# Patient Record
Sex: Male | Born: 1990 | Race: Black or African American | Hispanic: No | Marital: Single | Smoking: Former smoker
Health system: Southern US, Community
[De-identification: ages and names within clinical notes are randomized; demographics above are authoritative.]

## PROBLEM LIST (undated history)

## (undated) DIAGNOSIS — D509 Iron deficiency anemia, unspecified: Secondary | ICD-10-CM

## (undated) DIAGNOSIS — F419 Anxiety disorder, unspecified: Secondary | ICD-10-CM

## (undated) DIAGNOSIS — K649 Unspecified hemorrhoids: Secondary | ICD-10-CM

## (undated) HISTORY — DX: Iron deficiency anemia, unspecified: D50.9

## (undated) HISTORY — PX: COLONOSCOPY: SHX174

## (undated) HISTORY — PX: APPENDECTOMY: SHX54

## (undated) HISTORY — DX: Anxiety disorder, unspecified: F41.9

## (undated) HISTORY — DX: Unspecified hemorrhoids: K64.9

---
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
## 2009-01-15 ENCOUNTER — Ambulatory Visit: Payer: Self-pay | Admitting: Internal Medicine

## 2009-01-15 ENCOUNTER — Ambulatory Visit: Payer: Self-pay | Admitting: Family Medicine

## 2009-01-15 ENCOUNTER — Telehealth (INDEPENDENT_AMBULATORY_CARE_PROVIDER_SITE_OTHER): Payer: Self-pay | Admitting: *Deleted

## 2009-01-15 DIAGNOSIS — R519 Headache, unspecified: Secondary | ICD-10-CM | POA: Insufficient documentation

## 2009-01-15 DIAGNOSIS — R51 Headache: Secondary | ICD-10-CM | POA: Insufficient documentation

## 2009-01-15 LAB — CONVERTED CEMR LAB
ALT: 20 units/L (ref 0–53)
AST: 23 units/L (ref 0–37)
Albumin: 4.2 g/dL (ref 3.5–5.2)
Basophils Relative: 0.3 % (ref 0.0–3.0)
Bilirubin, Direct: 0.1 mg/dL (ref 0.0–0.3)
Calcium: 9.6 mg/dL (ref 8.4–10.5)
Chloride: 102 meq/L (ref 96–112)
Cholesterol: 133 mg/dL (ref 0–200)
Creatinine, Ser: 1.1 mg/dL (ref 0.4–1.5)
GFR calc non Af Amer: 110.65 mL/min (ref >60)
HDL: 37.9 mg/dL — ABNORMAL LOW (ref >39.00)
MCHC: 34.8 g/dL (ref 30.0–36.0)
MCV: 95.8 fL (ref 78.0–100.0)
Monocytes Absolute: 0.6 10*3/uL (ref 0.1–1.0)
Neutro Abs: 2.2 10*3/uL (ref 1.4–7.7)
Platelets: 148 10*3/uL — ABNORMAL LOW (ref 150.0–400.0)
RBC: 4.37 M/uL (ref 4.22–5.81)
RDW: 11.6 % (ref 11.5–14.6)
Sodium: 139 meq/L (ref 135–145)
TSH: 2.97 microintl units/mL (ref 0.35–5.50)
Triglycerides: 42 mg/dL (ref 0.0–149.0)
VLDL: 8.4 mg/dL (ref 0.0–40.0)
WBC: 4.7 10*3/uL (ref 4.5–10.5)

## 2009-02-04 ENCOUNTER — Ambulatory Visit: Payer: Self-pay | Admitting: Family Medicine

## 2009-02-04 DIAGNOSIS — J309 Allergic rhinitis, unspecified: Secondary | ICD-10-CM | POA: Insufficient documentation

## 2009-11-20 ENCOUNTER — Ambulatory Visit: Payer: Self-pay | Admitting: Family Medicine

## 2009-11-20 DIAGNOSIS — K644 Residual hemorrhoidal skin tags: Secondary | ICD-10-CM | POA: Insufficient documentation

## 2009-11-23 LAB — CONVERTED CEMR LAB
Basophils Absolute: 0 10*3/uL (ref 0.0–0.1)
Eosinophils Absolute: 0 10*3/uL (ref 0.0–0.7)
Eosinophils Relative: 0 % (ref 0–5)
Hemoglobin: 13.8 g/dL (ref 13.0–17.0)
Lymphocytes Relative: 57 % — ABNORMAL HIGH (ref 12–46)
Lymphs Abs: 2.7 10*3/uL (ref 0.7–4.0)
Monocytes Absolute: 0.4 10*3/uL (ref 0.1–1.0)
Neutro Abs: 1.6 10*3/uL — ABNORMAL LOW (ref 1.7–7.7)
Neutrophils Relative %: 34 % — ABNORMAL LOW (ref 43–77)
Platelets: 176 10*3/uL (ref 150–400)

## 2010-01-26 ENCOUNTER — Ambulatory Visit: Payer: Self-pay | Admitting: Family Medicine

## 2010-01-26 DIAGNOSIS — J029 Acute pharyngitis, unspecified: Secondary | ICD-10-CM | POA: Insufficient documentation

## 2010-01-27 ENCOUNTER — Encounter: Payer: Self-pay | Admitting: Family Medicine

## 2010-09-28 NOTE — Assessment & Plan Note (Signed)
Summary: bleeding hemorroids/kdc   Vital Signs:  Patient profile:   20 year old male Weight:      180 pounds BMI:     27.88 Pulse rate:   82 / minute BP sitting:   94 / 60  (left arm)  Vitals Entered By: Doristine Devoid (November 20, 2009 3:05 PM) CC: bleeding hemorrhoids noticed last week somewhat painful not external no problems w/ constipation   History of Present Illness: Pt here c/o ext hemrrhoids since last week.  No constipation, straining , no abd pain.     Current Medications (verified): 1)  Nasonex 50 Mcg/act Susp (Mometasone Furoate) .... 2 Sprays Each Nostril Once Daily 2)  Treximet 85-500 Mg Tabs (Sumatriptan-Naproxen Sodium) .Marland Kitchen.. 1 Tab As Needed For Severe Headache.  May Take 2nd Pill in 2 Hrs If Ha Not Improved.  Disp 1 Month 3)  Proctosol Hc 2.5 % Crea (Hydrocortisone) .... Apply Two Times A Day 4)  Anusol-Hc 25 Mg Supp (Hydrocortisone Acetate) .Marland Kitchen.. 1 Pr Two Times A Day  Allergies (verified): No Known Drug Allergies  Past History:  Past medical, surgical, family and social histories (including risk factors) reviewed for relevance to current acute and chronic problems.  Past Medical History: Reviewed history from 01/15/2009 and no changes required. Migraines  Past Surgical History: Reviewed history from 01/15/2009 and no changes required. Appendectomy  Family History: Reviewed history from 01/15/2009 and no changes required. CAD-no HTN-mother DM-no STROKE-no COLON CA-no PROSTATE CA-no  Social History: Reviewed history from 01/15/2009 and no changes required. 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 Rectal:  external hemorrhoid(s).  -- mult , large Psych:   Oriented X3 and normally interactive.     Impression & Recommendations:  Problem # 1:  EXTERNAL HEMORRHOIDS WITHOUT MENTION COMP (ICD-455.3) anusol and proctosol sitz baths Orders: Surgical Referral (Surgery) Venipuncture ([address]109)  Complete Medication List: 1)  Nasonex 50 Mcg/act Susp (Mometasone furoate) .... 2 sprays each nostril once daily 2)  Treximet 85-500 Mg Tabs (Sumatriptan-naproxen sodium) .Marland Kitchen.. 1 tab as needed for severe headache.  may take 2nd pill in 2 hrs if ha not improved.  disp 1 month 3)  Proctosol Hc 2.5 % Crea (Hydrocortisone) .... Apply two times a day 4)  Anusol-hc 25 Mg Supp (Hydrocortisone acetate) .Marland Kitchen.. 1 pr two times a day Prescriptions: ANUSOL-HC 25 MG SUPP (HYDROCORTISONE ACETATE) 1 pr two times a day  #2 weeks x 1   Entered and Authorized by:   Loreen Freud DO   Signed by:   Loreen Freud DO on 11/20/2009   Method used:   [facility] to        [facility]  [address] [phone]* (retail)       [address]       [place], [place]  [id number]       Ph: [phone] or [phone]       Fax: [phone]   RxID:   [id number] PROCTOSOL HC 2.5 % CREA (HYDROCORTISONE) apply two times a day  #2 weeks x 1   Entered and Authorized by:   Loreen Freud DO   Signed by:   Loreen Freud DO on 11/20/2009   Method used:   [facility]  [facility]  [address] [phone]* (retail)       [address]       [place], [place]  [id number]       Ph: [phone] or [phone]       Fax: [phone]   RxID:   [id number]

## 2010-09-28 NOTE — Assessment & Plan Note (Signed)
Summary: strep throat//lch   Vital Signs:  Patient profile:   20 year old male Weight:      173 pounds Temp:     98.6 degrees F oral BP sitting:   120 / 70  (left arm)  Vitals Entered By: Doristine Devoid (Jan 26, 2010 11:22 AM) CC: ST xsat.    History of Present Illness: 20 yo man here today for sore throat.  sxs started Saturday.  subjective fever.  no nausea, + anorexia.  no ear pain.  pain w/ turning neck.  + tender LAD.  no cough.  no known sick contacts  Allergies (verified): No Known Drug Allergies  Review of Systems      See HPI  Physical Exam  General:  Well-developed,well-nourished,in no acute distress; alert,appropriate and cooperative throughout examination Head:  Normocephalic and atraumatic without obvious abnormalities. No apparent alopecia or balding. Eyes:  no injxn or inflammation Ears:  External ear exam shows no significant lesions or deformities.  Otoscopic examination reveals clear canals, tympanic membranes are intact bilaterally without bulging, retraction, inflammation or discharge. Hearing is grossly normal bilaterally. Nose:  edematous turbinates bilaterally Mouth:  L tonsilar enlargement, + erythema and exudate. Neck:  + tender LAD on L Lungs:  Normal respiratory effort, chest expands symmetrically. Lungs are clear to auscultation, no crackles or wheezes. Heart:  Normal rate and regular rhythm. S1 and S2 normal without gallop, murmur, click, rub or other extra sounds.   Impression & Recommendations:  Problem # 1:  SORE THROAT (ICD-462) Assessment New despite (-) rapid strep will tx as strep and send for cx given appearance of L tonsil.  reviewed supportive care and red flags that should prompt return. Pt expresses understanding and is in agreement w/ this plan. His updated medication list for this problem includes:    Amoxicillin 500 Mg Caps (Amoxicillin) .Marland Kitchen... 1 tab by mouth two times a day x10 days  Orders: Specimen Handling (16109) T-Culture,  Throat ([address]454-09811) Rapid Strep (91478)  Complete Medication List: 1)  Nasonex 50 Mcg/act Susp (Mometasone furoate) .... 2 sprays each nostril once daily 2)  Treximet 85-500 Mg Tabs (Sumatriptan-naproxen sodium) .Marland Kitchen.. 1 tab as needed for severe headache.  may take 2nd pill in 2 hrs if ha not improved.  disp 1 month 3)  Amoxicillin 500 Mg Caps (Amoxicillin) .Marland Kitchen.. 1 tab by mouth two times a day x10 days  Patient Instructions: 1)  This looks like strep- we'll call you if we need to change the antibiotics 2)  Drink plenty of fluids 3)  Ibuprofen for pain and fever 4)  Call if you have increased pain, difficulty swallowing or breathing 5)  Hang in there!! Prescriptions: AMOXICILLIN 500 MG CAPS (AMOXICILLIN) 1 tab by mouth two times a day x10 days  #20 x 0   Entered and Authorized by:   Neena Rhymes MD   Signed by:   Neena Rhymes MD on 01/26/2010   Method used:   [facility]        [facility] [address] [phone]* (retail)       [address]       [place], [place]  [address]       Ph: [phone] or [phone]       Fax: [phone]   RxID:   [id number]   Laboratory Results    Other Tests  Rapid Strep: negative  Kit Test Internal QC: Positive   (Normal Range: Negative)

## 2010-10-30 IMAGING — CT CT HEAD W/O CM
1 series · 16 of 30 positions shown, 20 images · non-contrast
Comparison: 12/16/2005

CLINICAL DATA: Headaches

CT HEAD WITHOUT CONTRAST
TECHNIQUE: Contiguous axial images were obtained from the base of
the skull through the vertex without contrast.

[Series 2: head_seq 4.5 h37s st · axial · 0.43mm/px · z∈[-139,+5]mm · 16 of 36 slices shown, 20 images]
[im 2/36  brain]
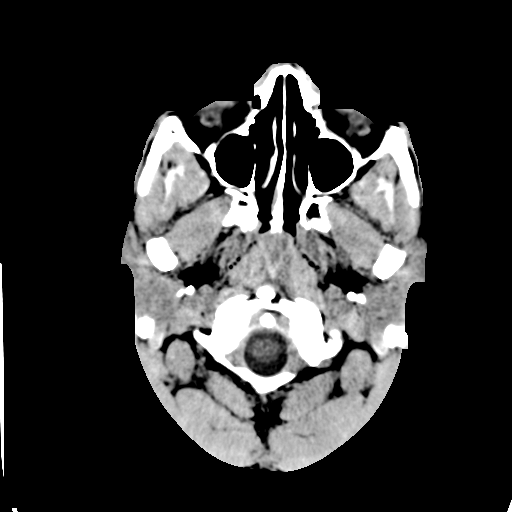
[im 2/36  bone]
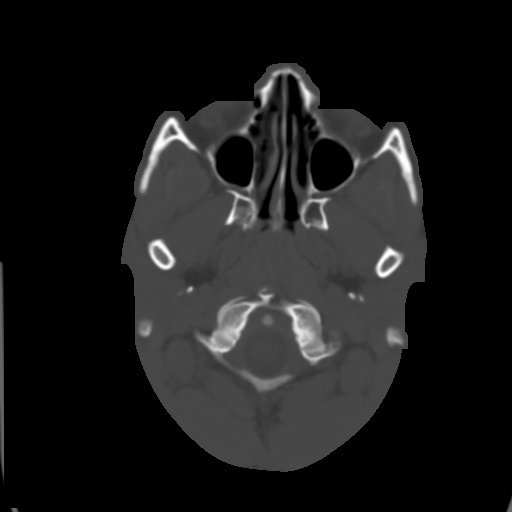
[im 4/36  brain]
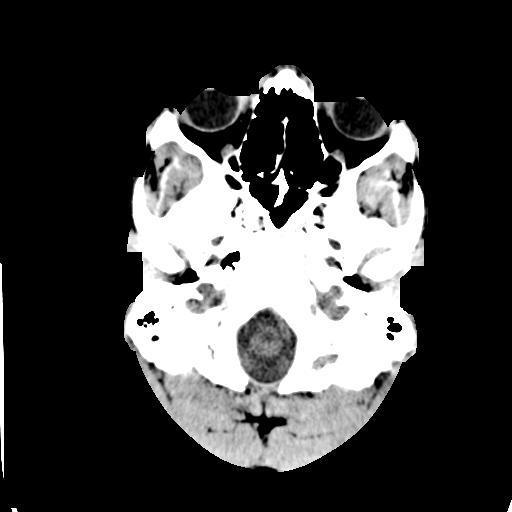
[im 7/36  brain]
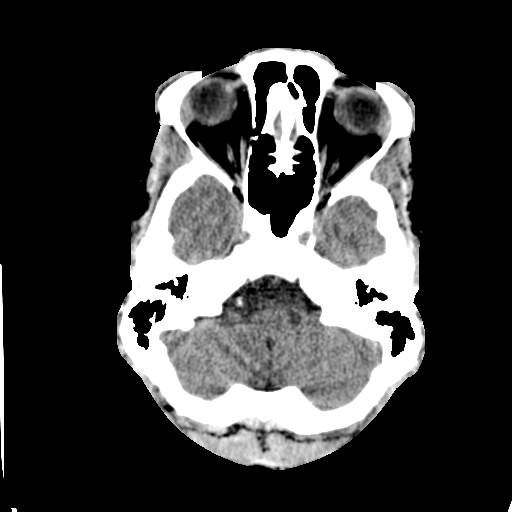
[im 9/36  brain]
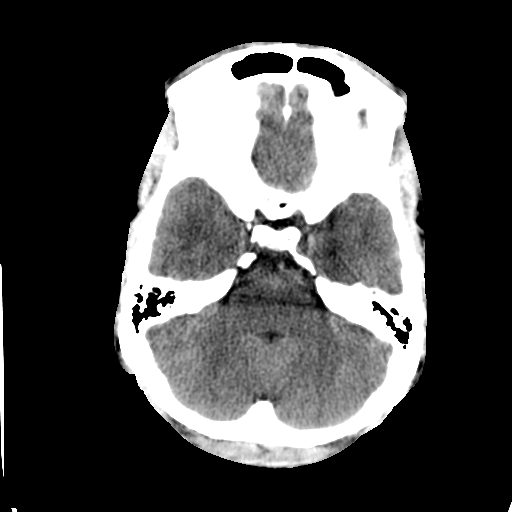
[im 10/36  brain]
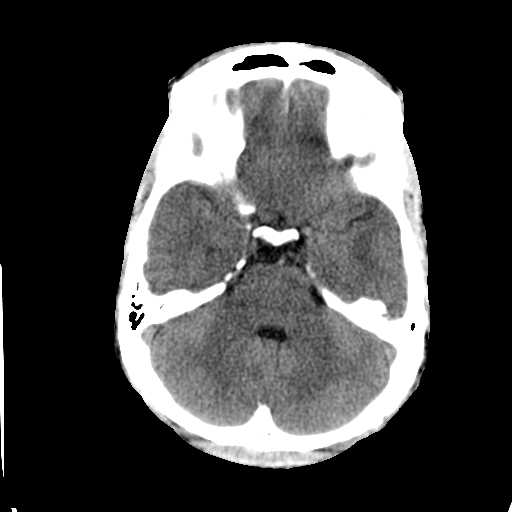
[im 10/36  bone]
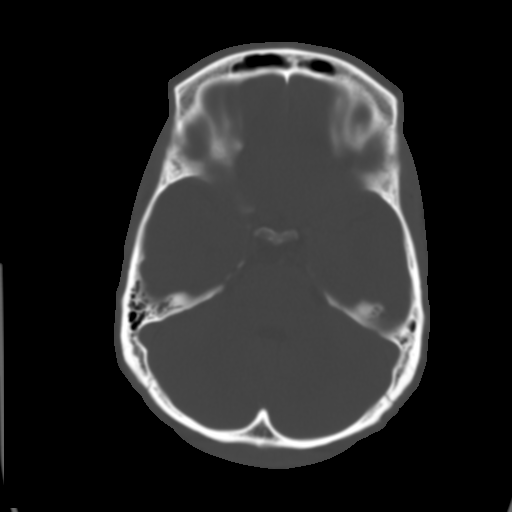
[im 13/36  brain]
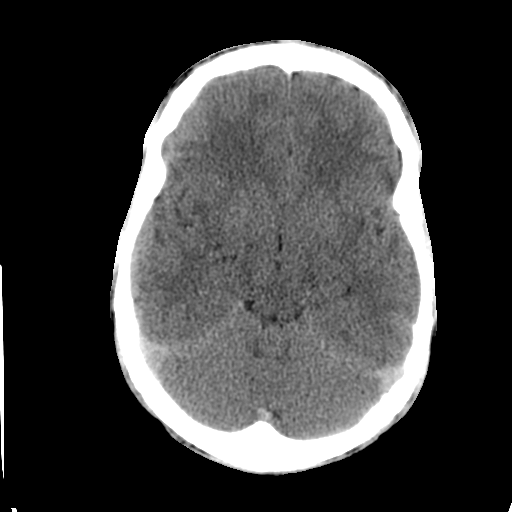
[im 15/36  brain]
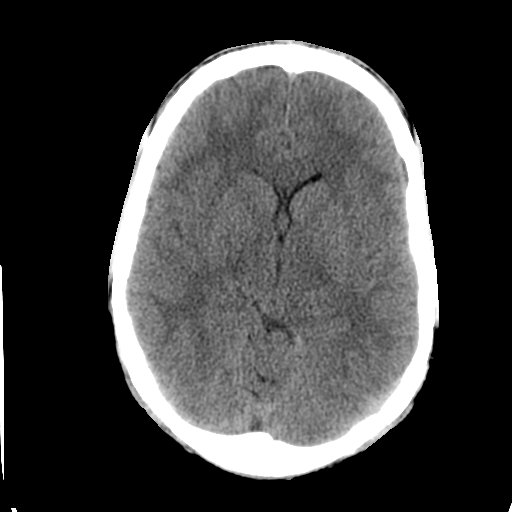
[im 17/36  brain]
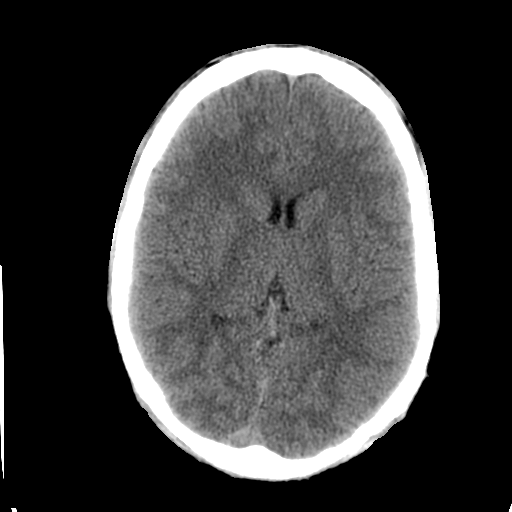
[im 19/36  brain]
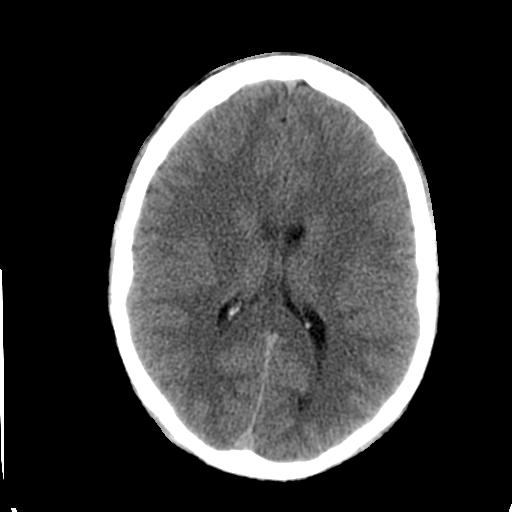
[im 19/36  bone]
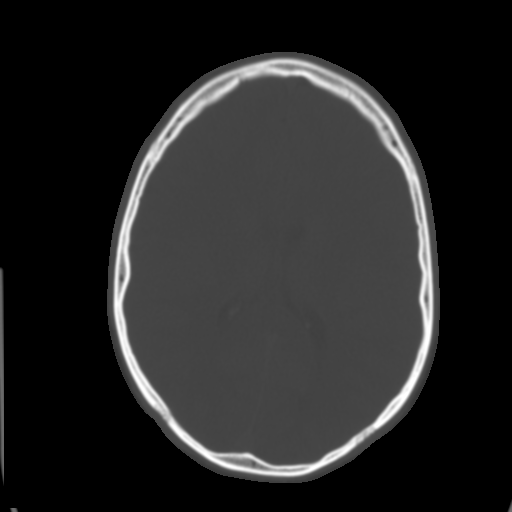
[im 21/36  brain]
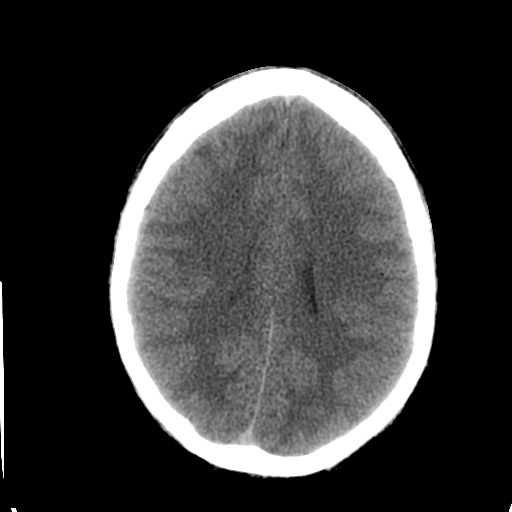
[im 23/36  brain]
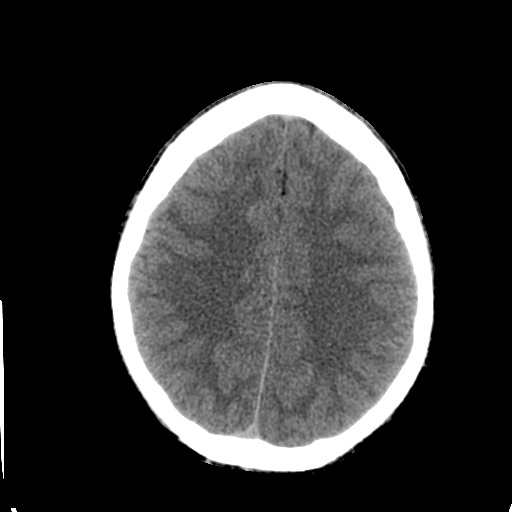
[im 26/36  brain]
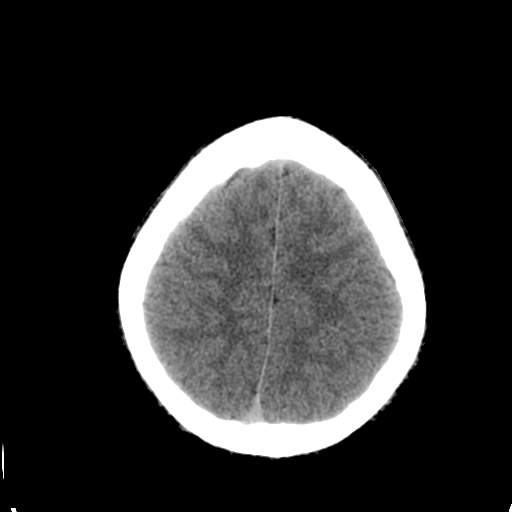
[im 27/36  brain]
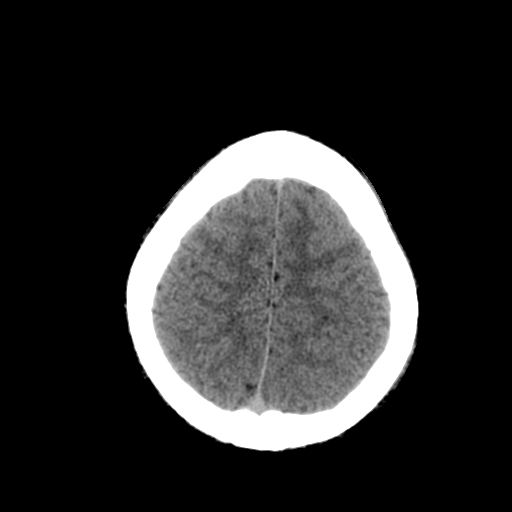
[im 27/36  bone]
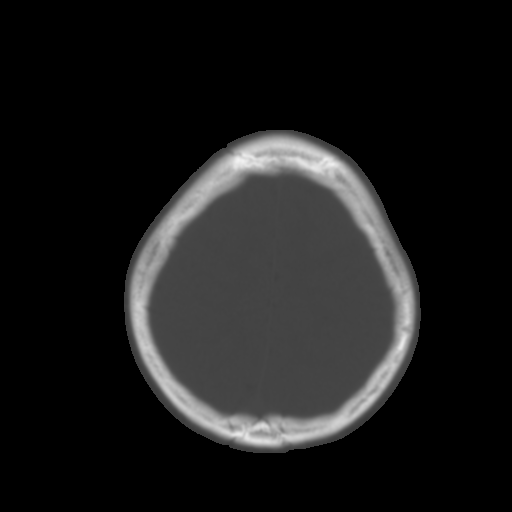
[im 29/36  brain]
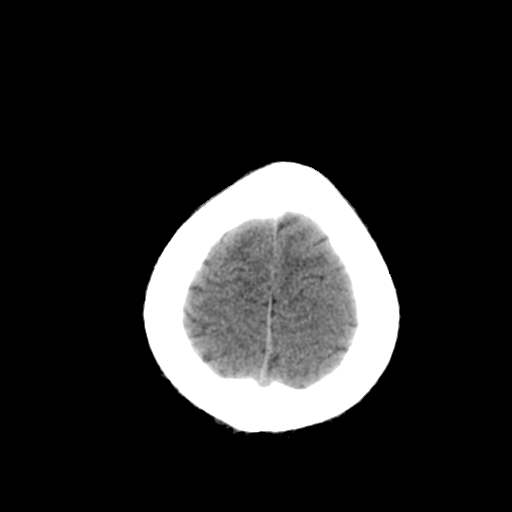
[im 32/36  brain]
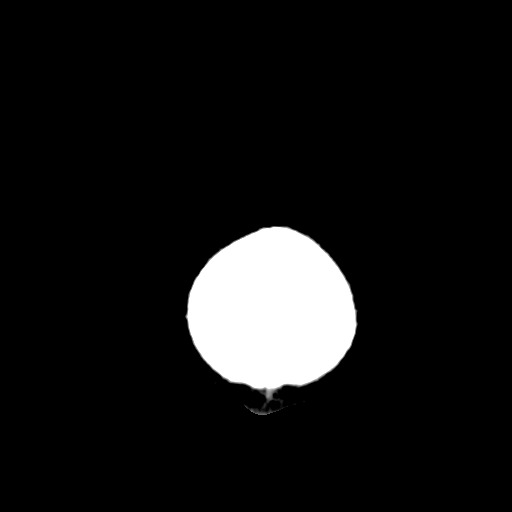
[im 34/36  brain]
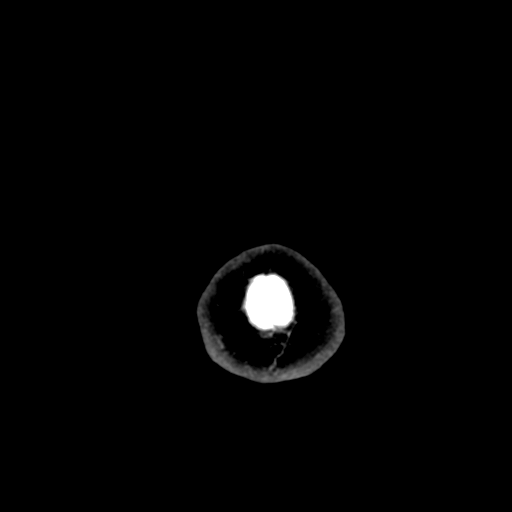

[16 of 30 positions shown; findings below may reference images not displayed]

FINDINGS: The brain has a normal appearance without evidence for
hemorrhage, infarction, hydrocephalus, or mass lesion.  There is no
extra axial fluid collection.  The skull and paranasal sinuses are
normal.
IMPRESSION: 1.  No acute intracranial abnormalities.

## 2011-01-11 NOTE — Discharge Summary (Signed)
NAME:  Dean Allen, Dean Allen              ACCOUNT NO.:  [id number]   MEDICAL RECORD NO.:  [id number]          PATIENT TYPE:  INP   LOCATION:  1522                         FACILITY:  [facility]   PHYSICIAN:  Lennie Muckle, MD      DATE OF BIRTH:  [birth year]   DATE OF ADMISSION:  12/30/2007  DATE OF DISCHARGE:  12/31/2007                               DISCHARGE SUMMARY   FINAL DIAGNOSIS:  Appendicitis.   HOSPITAL COURSE:  Mr. Lukes is an 20 year old male who was kept  postoperatively following laparoscopic appendectomy.  He had purulent  fluid and a positive appendicitis on laparoscopic exam.  He was kept on  IV Zosyn for 24 hours and is today tolerating a regular diet, having  pain well controlled, incisions sites are without evidence of infection,  and his abdominal exam reveals appropriate tenderness at the incision  site.   He will be discharged home on:  1. Augmentin for 5 days.  2. Percocet for pain.  3. Instructed to take over-the-counter stool softeners for      constipation.   ACTIVITY:  No heavy lifting greater than 20 pounds for 6 weeks.  He can  perform other activity as tolerated.   FOLLOWUP:  Lennie Muckle, M.D. in approximately 2-3 weeks.   Call if he develops any fevers or chills or increase in abdominal pain.      Lennie Muckle, MD  Electronically Signed     ALA/MEDQ  D:  12/31/2007  T:  12/31/2007  Job:  [id number]

## 2011-01-11 NOTE — Op Note (Signed)
NAME:  Dean Allen, Dean Allen              ACCOUNT NO.:  [id number]   MEDICAL RECORD NO.:  [id number]          PATIENT TYPE:  INP   LOCATION:  7564                         FACILITY:  [facility]   PHYSICIAN:  Lennie Muckle, MD      DATE OF BIRTH:  February 10, 1991   DATE OF PROCEDURE:  12/30/2007  DATE OF DISCHARGE:                               OPERATIVE REPORT   PREOPERATIVE DIAGNOSIS:  Appendicitis.   POSTOPERATIVE DIAGNOSIS:  Appendicitis.   PROCEDURE:  Laparoscopic appendectomy.   SURGEON:  Lennie Muckle, MD.   ASSISTANT:  None.   ANESTHESIA:  General endotracheal anesthesia.   SPECIMENS:  Appendix.   No immediate complications.  Normal amount of blood loss.  Appendix to  pathology for review.   INDICATIONS FOR PROCEDURE:  [name] is an 20 year old male who had had  abdominal pain for approximately 1 week which had worsened over the  course of 24 hours.  He had associated nausea and vomiting and anorexia.  CT showed inflamed appendix with some fluid in the pelvis.  Informed  consent was obtained with the patient and the mother for laparoscopic  appendectomy with possible open procedure with risks including but not  limited to bleeding, infection, post procedure abscess.   DESCRIPTION OF PROCEDURE:  [name] was identified in the preoperative  holding area, had received Zosyn 3.375 g in the emergency department.  He was then taken to the operating room and placed in a supine position.  After administration of general endotracheal anesthesia his abdomen was  prepped and draped in the usual sterile fashion.  A time-out procedure  indicating the patient and procedure were performed.  An incision was  placed at the umbilicus after anesthetizing the skin with 0.25%  Marcaine.  A Veress needle was placed in abdominal cavity to aid in  pneumoperitoneum.  The 5 mm trocar using the Optiview was then placed  through the umbilical incision.  There was no evidence of injury upon  placement of the  trocar or the Veress needle.  The abdomen was  inspected.  There was some murky fluid noted in abdominal cavity around  the liver and in the pelvis.  This was evacuated and suction irrigated.  A 5 mm port was placed in the right upper extremity after visualization  with the camera and a 12 mm trocar was placed in the left lower  quadrant.  The appendix was visualized coursing along the right pelvic  sidewall.  There were only a few adhesions that were able to be bluntly  dissected.  The lateral attachments were taken down with sharp  dissection as well as blunt dissection.  The appendix was retracted from  the base of the cecum.  I placed a, dissected a space between the  mesoappendix and the base of the appendix.  Using the laparoscopic  staple and #45 blue load I stapled at the base of appendix close to the  cecum.  The mesoappendix was also stapled.  The appendix was removed in  an EndoCatch bag.  The abdomen was irrigated with 2 liters of saline.  The  staple line was inspected and there was no evidence of bleeding and  the fluid was clear upon final exploration.  The 12 mm port site was  closed with zero Vicryl suture.  Upon final inspection of the abdominal  cavity there was no bleeding, no purulent fluid and no abdominal organ  injury.  The pneumoperitoneum was released.  The skin was closed with 4-  0 Monocryl  with Dermabond placed for final dressing.  The patient was extubated and  transferred to post anesthesia care unit in stable condition.  He will  be kept for 24 more hours of antibiotics and then likely be discharged  home tomorrow on Augmentin.      Lennie Muckle, MD  Electronically Signed     ALA/MEDQ  D:  12/30/2007  T:  12/30/2007  Job:  [id number]

## 2011-01-11 NOTE — H&P (Signed)
NAME:  Dean Allen, Dean Allen              ACCOUNT NO.:  [id number]   MEDICAL RECORD NO.:  [id number]          PATIENT TYPE:  EMS   LOCATION:  ED                           [facility]ACILITY:  [facility]   PHYSICIAN:  Lennie Muckle, MD      DATE OF BIRTH:  01/06/1991   DATE OF ADMISSION:  12/30/2007  DATE OF DISCHARGE:                              HISTORY & PHYSICAL   DIAGNOSIS:  Acute abdominal pain.   HISTORY OF PRESENT ILLNESS:  Dean Allen is an 20 year old male who had  onset of right lower quadrant pain approximately a week ago.  It  worsened in the past 24 hours with associated nausea and vomiting.  No  fevers or chills.  No diarrhea.  No difficulty with urination.  Has been  located in the right lower quadrant but has had some migration down to  the suprapubic area.  The pain is slightly worse with ambulation.  It is  persistent, has no alleviating factors.   PAST MEDICAL HISTORY:  Negative.   FAMILY HISTORY:  He is a Consulting civil engineer.   SOCIAL HISTORY:  No tobacco or alcohol use, occasional marijuana use.   MEDICATIONS:  None.   No known drug allergies.   REVIEW OF SYSTEMS:  Negative including cardiovascular, pulmonary,  gastrointestinal, neurologic, genitourinary and musculoskeletal.   PHYSICAL EXAMINATION:  Temperature is 98.4, pulse 74, blood pressure  130/55.  GENERAL:  He appears uncomfortable lying in bed, appears his stated age.  HEENT EXAM:  Extraocular muscles are intact.  No scleral icterus is  evident.  Pupils are equal and round.  CHEST:  Clear to auscultation bilaterally.  CARDIOVASCULAR:  Regular rate and rhythm.  ABDOMEN:  Tender in the right lower quadrant.  Positive Rovsing sign.  EXTREMITIES:  No deformity or edema are noted.  SKIN EXAM:  Grossly within normal limits.  NEUROLOGICAL EXAM:  Cranial nerves II-XII are grossly intact.  No focal  deficits.   LABORATORIES AND X-RAYS:  CT shows thickened appendix with small amount  of fluid in the pelvis.  White count is elevated at  14.3, hemoglobin and  hematocrit 14 and 40, platelets are 181.  Serum chemistries:  Mild  elevation of glucose to 125, otherwise within normal limits.   ASSESSMENT AND PLAN:  Appendicitis.  Laparoscopic appendectomy, possible  open procedure were explained to the patient and his mother who is  present in the room.  He will receive IV Zosyn now, and then depending  on the  findings at the time of the OR will either be able to discharge him home  later or will keep him for antibiotics.  I explained to the patient and  his mother the possibilities of a postoperative abscess and the risk of  minimal amount of bleeding during the procedure.  All questions were  answered.      Lennie Muckle, MD  Electronically Signed     ALA/MEDQ  D:  12/30/2007  T:  12/30/2007  Job:  [id number]

## 2012-10-02 ENCOUNTER — Emergency Department (HOSPITAL_COMMUNITY)
Admission: EM | Admit: 2012-10-02 | Discharge: 2012-10-02 | Disposition: A | Discharge status: Home / Self Care | Payer: 59 | Attending: Emergency Medicine | Admitting: Emergency Medicine

## 2012-10-02 ENCOUNTER — Encounter (HOSPITAL_COMMUNITY): Payer: Self-pay | Admitting: Emergency Medicine

## 2012-10-02 DIAGNOSIS — Z202 Contact with and (suspected) exposure to infections with a predominantly sexual mode of transmission: Secondary | ICD-10-CM | POA: Insufficient documentation

## 2012-10-02 MED ORDER — METRONIDAZOLE 500 MG PO TABS
2000.0000 mg | ORAL_TABLET | Freq: Once | ORAL | Status: AC
  Administered 2012-10-02: 2000 mg via ORAL
  Filled 2012-10-02: qty 4

## 2012-10-02 MED ORDER — AZITHROMYCIN 250 MG PO TABS
1000.0000 mg | ORAL_TABLET | Freq: Once | ORAL | Status: AC
  Administered 2012-10-02: 1000 mg via ORAL
  Filled 2012-10-02: qty 4

## 2012-10-02 MED ORDER — LIDOCAINE HCL (PF) 1 % IJ SOLN
INTRAMUSCULAR | Status: AC
  Administered 2012-10-02: 0.9 mL
  Filled 2012-10-02: qty 5

## 2012-10-02 MED ORDER — CEFTRIAXONE SODIUM 250 MG IJ SOLR
250.0000 mg | Freq: Once | INTRAMUSCULAR | Status: AC
  Administered 2012-10-02: 250 mg via INTRAMUSCULAR
  Filled 2012-10-02: qty 250

## 2012-10-02 NOTE — ED Notes (Signed)
Pt reports girlfriend informed him today that she is positive for trichomonas and that he needed to get treated. Pt states that last sexual activity with girlfriend was this past Saturday. Denies penile discharge, odor, pain.

## 2012-10-02 NOTE — ED Notes (Signed)
Pt sts exposed to trichomonas; pt denies complaint and sts here for treatment

## 2012-10-02 NOTE — ED Provider Notes (Signed)
History     CSN: [id number]  Arrival date & time 10/02/12  1826   First MD Initiated Contact with Patient 10/02/12 1840      Chief Complaint  Patient presents with  . Exposure to STD    (Consider location/radiation/quality/duration/timing/severity/associated sxs/prior treatment) HPI Dean Allen is a 22 y.o. male who presents with complaint of exposure to STDs. States a girl he dated just got diagnosed with trichomonas and possible gonorrhea, chlamydia. States wants to be treated. No symptoms. Last intercourse 4 days ago. No pain with urination. No penile discharge. No lesions.    History reviewed. No pertinent past medical history.  History reviewed. No pertinent past surgical history.  History reviewed. No pertinent family history.  History  Substance Use Topics  . Smoking status: Never Smoker   . Smokeless tobacco: Not on file  . Alcohol Use: Yes      Review of Systems  Constitutional: Negative for fever and chills.  Gastrointestinal: Negative for abdominal pain.  Genitourinary: Negative for discharge, penile swelling, scrotal swelling, genital sores, penile pain and testicular pain.    Allergies  Review of patient's allergies indicates no known allergies.  Home Medications  No current outpatient prescriptions on file.  BP 152/70  Pulse 86  Temp 98.3 F (36.8 C) (Oral)  Resp 18  SpO2 100%  Physical Exam  Nursing note and vitals reviewed. Constitutional: He appears well-developed and well-nourished. No distress.  Abdominal: There is no tenderness.  Genitourinary: Penis normal.    ED Course  Procedures (including critical care time)  Labs Reviewed - No data to display No results found.   1. Exposure to STD       MDM  PT with STD exposure, normal exam. No symptoms. Treated with rocephin 250IM, zithromax 1g, flagyl 2g. Follow up with health dept.         Lottie Mussel, PA 10/02/12 1939

## 2012-10-03 NOTE — ED Provider Notes (Signed)
Medical screening examination/treatment/procedure(s) were performed by non-physician practitioner and as supervising physician I was immediately available for consultation/collaboration.   Joya Gaskins, MD 10/03/12 Jorje Guild

## 2013-01-08 ENCOUNTER — Telehealth: Payer: Self-pay | Admitting: Internal Medicine

## 2013-01-08 ENCOUNTER — Telehealth: Payer: Self-pay | Admitting: Gastroenterology

## 2013-01-08 NOTE — Telephone Encounter (Signed)
S/W PT IN RE TO NP APPT 05/19 @ 3:30 W/DR/.MOHAMED. REFERRING DR. Lorin Picket [place] DX- MICROCYTIC ANEMIA WELCOME PACKET MAILED.

## 2013-01-08 NOTE — Telephone Encounter (Signed)
Pt will see [name], PA tomorrow at 09:15am; Malachi Bonds at [facility] is aware and will fax info and inform pt of appt.

## 2013-01-09 ENCOUNTER — Other Ambulatory Visit (INDEPENDENT_AMBULATORY_CARE_PROVIDER_SITE_OTHER): Payer: 59

## 2013-01-09 ENCOUNTER — Encounter: Payer: Self-pay | Admitting: Gastroenterology

## 2013-01-09 ENCOUNTER — Ambulatory Visit (INDEPENDENT_AMBULATORY_CARE_PROVIDER_SITE_OTHER): Payer: 59 | Admitting: Gastroenterology

## 2013-01-09 ENCOUNTER — Telehealth: Payer: Self-pay | Admitting: Internal Medicine

## 2013-01-09 ENCOUNTER — Encounter: Payer: Self-pay | Admitting: Internal Medicine

## 2013-01-09 VITALS — BP 118/72 | HR 123 | Ht 67.5 in | Wt 173.6 lb

## 2013-01-09 DIAGNOSIS — D509 Iron deficiency anemia, unspecified: Secondary | ICD-10-CM

## 2013-01-09 DIAGNOSIS — K922 Gastrointestinal hemorrhage, unspecified: Secondary | ICD-10-CM

## 2013-01-09 HISTORY — DX: Iron deficiency anemia, unspecified: D50.9

## 2013-01-09 LAB — IGA: IgA: 152 mg/dL (ref 68–378)

## 2013-01-09 MED ORDER — MOVIPREP 100 G PO SOLR: 1.0000 | Freq: Once | ORAL | Status: DC

## 2013-01-09 NOTE — Patient Instructions (Addendum)
You have been given a separate informational sheet regarding your tobacco use, the importance of quitting and local resources to help you quit. Your physician has requested that you go to the basement for the following lab work before leaving today:  Celiac Testing You have been scheduled for a colonoscopy with propofol. Please follow written instructions given to you at your visit today.  Please pick up your prep kit at the pharmacy within the next 1-3 days. If you use inhalers (even only as needed), please bring them with you on the day of your procedure. Your physician has requested that you go to www.startemmi.com and enter the access code given to you at your visit today. This web site gives a general overview about your procedure. However, you should still follow specific instructions given to you by our office regarding your preparation for the procedure. CC:  Alysia Penna MD

## 2013-01-09 NOTE — Progress Notes (Signed)
01/09/2013 Dean Allen [id number] Oct 04, 1990   HISTORY OF PRESENT ILLNESS:  Patient is a 22 year old male who presents to our office today at the request of his PCP.  He had labs performed, which showed a Hgb of 9.2 grams.  Iron studies were very low.  His PCP contacted him yesterday and asked him to begin taking iron pills 325 mg BID, which he started last night.  He was given 3 stool cards to check for occult blood and just performed one of those last night.  He says that his appetite is good and he eats a fairly healthy diet.  No weight loss, nausea, vomiting, constipation, diarrhea, abdominal pain, heartburn, reflux, or problems swallowing.  He admits that he sees blood in the toilet bowl a couple of times a month.  Feels like he has not energy.  No family history of colon cancer to his knowledge.  No dark black stools.  TSH, CMP, and remaining CBC are unremarkable.  Admits that he eats a lot of ice.   Past Medical History  Diagnosis Date  . Iron deficiency anemia   . Hemorrhoids    Past Surgical History  Procedure Laterality Date  . Appendectomy      reports that he has been smoking.  He has never used smokeless tobacco. He reports that  drinks alcohol. He reports that he does not use illicit drugs. family history includes Alcoholism in his father; Arthritis in his maternal grandfather; Breast cancer in his maternal grandmother; Glaucoma in his maternal grandfather; and Hypertension in his mother. No Known Allergies    Outpatient Encounter Prescriptions as of 01/09/2013  Medication Sig Dispense Refill  . ferrous sulfate 325 (65 FE) MG tablet Take 325 mg by mouth daily with breakfast.      . oxymetazoline (AFRIN) 0.05 % nasal spray Place 2 sprays into the nose 2 (two) times daily.       No facility-administered encounter medications on file as of 01/09/2013.     REVIEW OF SYSTEMS  : All other systems reviewed and negative except where noted in the History of Present  Illness.   PHYSICAL EXAM: BP 118/72  Pulse 123  Ht 5' 7.5" (1.715 m)  Wt 173 lb 9.6 oz (78.744 kg)  BMI 26.77 kg/m2  SpO2 98% General: Well developed black male in no acute distress Head: Normocephalic and atraumatic Eyes:  sclerae anicteric,conjunctive pink. Ears: Normal auditory acuity Lungs: Clear throughout to auscultation Heart: Regular rate and rhythm Abdomen: Soft, nontender, non-distended. No masses or hepatomegaly noted. Normal bowel sounds. Rectal: Deferred.  Will be performed at the time of colonoscopy. Musculoskeletal: Symmetrical with no gross deformities  Skin: No lesions on visible extremities Extremities: No edema  Neurological: Alert oriented x 4, grossly nonfocal Psychological:  Alert and cooperative. Normal mood and affect  ASSESSMENT AND PLAN: -IDA:  Hgb 9.2 grams and very low iron studies.  Will check labs for celiac.  Continue iron supplements as directed by his PCP (325 mg BID)/. -LGIB:  Blood in toilet bowl twice a month.  Will schedule colonoscopy.

## 2013-01-09 NOTE — Telephone Encounter (Signed)
C/D 01/09/13 for appt. 01/14/13

## 2013-01-10 NOTE — Progress Notes (Signed)
Agree with management.  Iva Boop, MD, Clementeen Graham

## 2013-01-14 ENCOUNTER — Telehealth: Payer: Self-pay | Admitting: Internal Medicine

## 2013-01-14 ENCOUNTER — Encounter: Payer: Self-pay | Admitting: Internal Medicine

## 2013-01-14 ENCOUNTER — Ambulatory Visit (HOSPITAL_BASED_OUTPATIENT_CLINIC_OR_DEPARTMENT_OTHER): Payer: 59 | Admitting: Internal Medicine

## 2013-01-14 ENCOUNTER — Other Ambulatory Visit (HOSPITAL_BASED_OUTPATIENT_CLINIC_OR_DEPARTMENT_OTHER): Payer: 59

## 2013-01-14 ENCOUNTER — Ambulatory Visit: Payer: 59

## 2013-01-14 VITALS — BP 129/81 | HR 77 | Temp 97.0°F | Resp 18 | Ht 68.0 in | Wt 175.6 lb

## 2013-01-14 DIAGNOSIS — D509 Iron deficiency anemia, unspecified: Secondary | ICD-10-CM

## 2013-01-14 LAB — COMPREHENSIVE METABOLIC PANEL (CC13)
ALT: 14 U/L (ref 0–55)
Albumin: 4 g/dL (ref 3.5–5.0)
CO2: 27 mEq/L (ref 22–29)
Chloride: 103 mEq/L (ref 98–107)
Creatinine: 1.2 mg/dL (ref 0.7–1.3)
Glucose: 103 mg/dl — ABNORMAL HIGH (ref 70–99)
Potassium: 4.1 mEq/L (ref 3.5–5.1)
Total Bilirubin: 0.6 mg/dL (ref 0.20–1.20)

## 2013-01-14 LAB — CBC & DIFF AND RETIC
BASO%: 1 % (ref 0.0–2.0)
Eosinophils Absolute: 0 10*3/uL (ref 0.0–0.5)
HCT: 31.7 % — ABNORMAL LOW (ref 38.4–49.9)
HGB: 9.2 g/dL — ABNORMAL LOW (ref 13.0–17.1)
MCH: 20.6 pg — ABNORMAL LOW (ref 27.2–33.4)
MONO#: 0.5 10*3/uL (ref 0.1–0.9)
MONO%: 11.5 % (ref 0.0–14.0)
NEUT%: 38.1 % — ABNORMAL LOW (ref 39.0–75.0)
RBC: 4.46 10*6/uL (ref 4.20–5.82)
RDW: 20.1 % — ABNORMAL HIGH (ref 11.0–14.6)
Retic Ct Abs: 107.04 10*3/uL — ABNORMAL HIGH (ref 34.80–93.90)

## 2013-01-14 LAB — LACTATE DEHYDROGENASE (CC13): LDH: 132 U/L (ref 125–245)

## 2013-01-14 MED ORDER — INTEGRA PLUS PO CAPS: 1.0000 | ORAL_CAPSULE | Freq: Every morning | ORAL | Status: DC

## 2013-01-14 NOTE — Progress Notes (Signed)
[facility] [phone]   Fax:([phone]  CONSULT NOTE  REFERRING PHYSICIAN: Dr. Alysia Penna  REASON FOR CONSULTATION:  22 years old African American male with Iron deficiency anemia  HPI Kyzen Horn is a 22 y.o. male was no significant past medical history he was seen his primary care physician Dr. Link Snuffer for routine physical examination and to establish care with him. The patient mentioned a history of hemorrhoids. And CBC performed on 01/04/2013 showed the hemoglobin of 9.1 hematocrit 29.5% with MCV of 67.1. Iron studies showed elevated total iron binding capacity 499 with iron saturation of 3% and serum iron of 17. Ferritin was 1.7.  The patient was referred to me today for evaluation and recommendation regarding his iron deficiency anemia. He was started on over the counter ferrous sulfate 1 tablet by mouth daily for one week. He was also referred to Dr. Elenore Paddy for consideration of colonoscopy because of his iron deficiency anemia. This is scheduled to be performed on 01/18/2013. He denied having any significant bleeding issues except for occasional hemorrhoidal bleed. He denied having any black tarry stool. He denied having any epistaxis, hematemesis, hemoptysis or hematuria. He has no bruises or ecchymosis. The patient denied having any significant weight loss or night sweats. He denied having any chest pain, shortness of breath, cough or hemoptysis. Family history significant for her maternal grandfather with colon cancer, mother has hypertension and dyslipidemia. Father with unknown medical history. The patient is single and has one child. He works in an [employer]. He smokes occasionally and I strongly advise him to quit smoking. He also drinks heavily on the weekends. I advised him to quit drinking. No history of drug abuse.  @SFHPI @  Past Medical History  Diagnosis Date  . Iron deficiency anemia   . Hemorrhoids     Past Surgical  History  Procedure Laterality Date  . Appendectomy      Family History  Problem Relation Age of Onset  . Glaucoma Maternal Grandfather   . Hypertension Mother   . Alcoholism Father   . Arthritis Maternal Grandfather   . Breast cancer Maternal Grandmother     Social History History  Substance Use Topics  . Smoking status: Current Some Day Smoker  . Smokeless tobacco: Never Used     Comment: started in 2010  . Alcohol Use: Yes     Comment: weekends will drink some liquor    No Known Allergies  Current Outpatient Prescriptions  Medication Sig Dispense Refill  . ferrous sulfate 325 (65 FE) MG tablet Take 325 mg by mouth daily with breakfast.      . FeFum-FePoly-FA-B Cmp-C-Biot (INTEGRA PLUS) CAPS Take 1 capsule by mouth every morning.  30 capsule  2  . oxymetazoline (AFRIN) 0.05 % nasal spray Place 2 sprays into the nose 2 (two) times daily.       No current facility-administered medications for this visit.    Review of Systems  A comprehensive review of systems was negative.  Physical Exam  AVW:UJWJX, healthy, no distress, well nourished and well developed SKIN: skin color, texture, turgor are normal HEAD: Normocephalic, No masses, lesions, tenderness or abnormalities EYES: normal, PERRLA EARS: External ears normal OROPHARYNX:no exudate and no erythema  NECK: supple, no adenopathy LYMPH:  no palpable lymphadenopathy, no hepatosplenomegaly LUNGS: clear to auscultation , and palpation HEART: regular rate & rhythm, no murmurs and no gallops ABDOMEN:abdomen soft, non-tender, normal bowel sounds and no masses or organomegaly BACK: Back symmetric, no  curvature. EXTREMITIES:no joint deformities, effusion, or inflammation, no edema, no skin discoloration  NEURO: alert & oriented x 3 with fluent speech, no focal motor/sensory deficits  PERFORMANCE STATUS: ECOG 1  LABORATORY DATA: Lab Results  Component Value Date   WBC 4.2 01/14/2013   HGB 9.2* 01/14/2013   HCT 31.7*  01/14/2013   MCV 71.1* 01/14/2013   PLT 233 01/14/2013      Chemistry      Component Value Date/Time   NA 139 01/14/2013 1546   NA 139 01/15/2009 0838   K 4.1 01/14/2013 1546   K 4.0 01/15/2009 0838   CL 103 01/14/2013 1546   CL 102 01/15/2009 0838   CO2 27 01/14/2013 1546   CO2 29 01/15/2009 0838   BUN 11.1 01/14/2013 1546   BUN 12 01/15/2009 0838   CREATININE 1.2 01/14/2013 1546   CREATININE 1.1 01/15/2009 0838      Component Value Date/Time   CALCIUM 9.3 01/14/2013 1546   CALCIUM 9.6 01/15/2009 0838   ALKPHOS 53 01/14/2013 1546   ALKPHOS 85 01/15/2009 0838   AST 19 01/14/2013 1546   AST 23 01/15/2009 0838   ALT 14 01/14/2013 1546   ALT 20 01/15/2009 0838   BILITOT 0.60 01/14/2013 1546   BILITOT 0.8 01/15/2009 0838       RADIOGRAPHIC STUDIES: No results found.  ASSESSMENT: This is a very pleasant 22 years old [place] American male with history of iron deficiency anemia most likely secondary to blood loss from GI source, questionable hemorrhoidal bleed versus other lower GI bleed.   PLAN: I had a lengthy discussion with the patient and his mother today about his condition. I ordered several studies to evaluate his anemia and to rule out any other etiology. I ordered repeat CBC, comprehensive metabolic panel, LDH, iron study, ferritin, hemoglobin electrophoresis. The patient is scheduled for colonoscopy on 01/18/2013.   I also recommended for the patient to start treatment with Integra plus 1 capsule by mouth daily for the next month. I will see him back for followup visit in one month with repeat CBC and iron study. I would consider seeing the patient earlier if there is any significant abnormality on his colonoscopy that require medical oncology attention. The patient and his mother agreed to the current plan. He was advised to call immediately if he has any concerning symptoms in the interval. All questions were answered. The patient knows to call the clinic with any problems, questions  or concerns. We can certainly see the patient much sooner if necessary.  Thank you so much for allowing me to participate in the care of [employer]. I will continue to follow up the patient with you and assist in his care.  I spent 30 minutes counseling the patient face to face. The total time spent in the appointment was 55 minutes.  Estiven Kohan K. 01/14/2013, 5:18 PM

## 2013-01-14 NOTE — Patient Instructions (Addendum)
You have iron deficiency anemia. I will start you on Integra plus 1 capsule by mouth daily. Colonoscopy as scheduled by Dr. Leone Payor

## 2013-01-14 NOTE — Progress Notes (Signed)
Checked in new patient. No financial issues. °

## 2013-01-14 NOTE — Telephone Encounter (Signed)
Gave pt appt for lab and md on June 2014

## 2013-01-16 ENCOUNTER — Telehealth: Payer: Self-pay | Admitting: Internal Medicine

## 2013-01-16 LAB — IRON AND TIBC
Iron: 133 ug/dL (ref 42–165)
TIBC: 478 ug/dL — ABNORMAL HIGH (ref 215–435)

## 2013-01-16 LAB — VITAMIN B12: Vitamin B-12: 366 pg/mL (ref 211–911)

## 2013-01-16 LAB — FOLATE: Folate: 9.1 ng/mL

## 2013-01-16 LAB — HEMOGLOBINOPATHY EVALUATION: Hemoglobin Other: 0 %

## 2013-01-16 NOTE — Telephone Encounter (Signed)
It is not a big deal - ok to hold now - can keep procedure as scheduled

## 2013-01-17 NOTE — Telephone Encounter (Signed)
Left a message for the pt on 01-16-13 informing him that per Dr. Leone Payor it is okay to proceed with his procedure, and to just hold on taking the iron pills.

## 2013-01-18 ENCOUNTER — Encounter: Payer: Self-pay | Admitting: Internal Medicine

## 2013-01-18 ENCOUNTER — Ambulatory Visit (AMBULATORY_SURGERY_CENTER): Payer: 59 | Admitting: Internal Medicine

## 2013-01-18 VITALS — BP 112/66 | HR 73 | Temp 98.4°F | Resp 12 | Ht 68.0 in | Wt 175.0 lb

## 2013-01-18 DIAGNOSIS — K648 Other hemorrhoids: Secondary | ICD-10-CM

## 2013-01-18 DIAGNOSIS — D509 Iron deficiency anemia, unspecified: Secondary | ICD-10-CM

## 2013-01-18 DIAGNOSIS — K922 Gastrointestinal hemorrhage, unspecified: Secondary | ICD-10-CM

## 2013-01-18 LAB — TISSUE TRANSGLUTAMINASE, IGA: Tissue Transglutaminase Ab, IgA: 1 U/mL (ref <4)

## 2013-01-18 MED ORDER — SODIUM CHLORIDE 0.9 % IV SOLN: 500.0000 mL | INTRAVENOUS | Status: DC

## 2013-01-18 NOTE — Progress Notes (Signed)
Patient did not experience any of the following events: a burn prior to discharge; a fall within the facility; wrong site/side/patient/procedure/implant event; or a hospital transfer or hospital admission upon discharge from the facility. (G8907) Patient did not have preoperative order for IV antibiotic SSI prophylaxis. (G8918)  

## 2013-01-18 NOTE — Progress Notes (Signed)
Report to pacu RN, vss, bbs=clear 

## 2013-01-18 NOTE — Patient Instructions (Addendum)
I found hemorrhoids which could be the cause of the anemia and low iron if they have been bleeding on a chronic basis. I can treat these and hopefully solve this problem by placing rubber bands on the hemorrhoids - office procedure. I have provided an information sheet about this and the website listed has videos you can watch. I am still waiting for the results of a blood test to check for celiac disease to come in - these have been delayed but they should process it.  I will contact you about that and we will also schedule a follow-up about the hemorrhoids.  There were no other problems seen at the colonoscopy - otherwise normal.  I appreciate the opportunity to care for you.  Iva Boop, MD, FACG  YOU HAD AN ENDOSCOPIC PROCEDURE TODAY AT THE [facility]: Refer to the procedure report that was given to you for any specific questions about what was found during the examination.  If the procedure report does not answer your questions, please call your gastroenterologist to clarify.  If you requested that your care partner not be given the details of your procedure findings, then the procedure report has been included in a sealed envelope for you to review at your convenience later.  YOU SHOULD EXPECT: Some feelings of bloating in the abdomen. Passage of more gas than usual.  Walking can help get rid of the air that was put into your GI tract during the procedure and reduce the bloating. If you had a lower endoscopy (such as a colonoscopy or flexible sigmoidoscopy) you may notice spotting of blood in your stool or on the toilet paper. If you underwent a bowel prep for your procedure, then you may not have a normal bowel movement for a few days.  DIET: Your first meal following the procedure should be a light meal and then it is ok to progress to your normal diet.  A half-sandwich or bowl of soup is an example of a good first meal.  Heavy or fried foods are harder to digest and may  make you feel nauseous or bloated.  Likewise meals heavy in dairy and vegetables can cause extra gas to form and this can also increase the bloating.  Drink plenty of fluids but you should avoid alcoholic beverages for 24 hours.  ACTIVITY: Your care partner should take you home directly after the procedure.  You should plan to take it easy, moving slowly for the rest of the day.  You can resume normal activity the day after the procedure however you should NOT DRIVE or use heavy machinery for 24 hours (because of the sedation medicines used during the test).    SYMPTOMS TO REPORT IMMEDIATELY: A gastroenterologist can be reached at any hour.  During normal business hours, 8:30 AM to 5:00 PM Monday through Friday, call [phone].  After hours and on weekends, please call the GI answering service at [phone] who will take a message and have the physician on call contact you.   Following lower endoscopy (colonoscopy or flexible sigmoidoscopy):  Excessive amounts of blood in the stool  Significant tenderness or worsening of abdominal pains  Swelling of the abdomen that is new, acute  Fever of 100F or higher  FOLLOW UP: If any biopsies were taken you will be contacted by phone or by letter within the next 1-3 weeks.  Call your gastroenterologist if you have not heard about the biopsies in 3 weeks.  Our staff will  call the home number listed on your records the next business day following your procedure to check on you and address any questions or concerns that you may have at that time regarding the information given to you following your procedure. This is a courtesy call and so if there is no answer at the home number and we have not heard from you through the emergency physician on call, we will assume that you have returned to your regular daily activities without incident.  SIGNATURES/CONFIDENTIALITY: You and/or your care partner have signed paperwork which will be entered into your  electronic medical record.  These signatures attest to the fact that that the information above on your After Visit Summary has been reviewed and is understood.  Full responsibility of the confidentiality of this discharge information lies with you and/or your care-partner.  Office will call to schedule appointment for follow-up to discuss hemorrhoid treatment  Hemorrhoid-handout given  You can take 1 table spoon of benefiber in morning to help with hemorrhoids

## 2013-01-18 NOTE — Op Note (Signed)
[facility] [address] [place], [address]   COLONOSCOPY PROCEDURE REPORT  PATIENT: Dean Allen, Dean Allen  MR#: [id number] BIRTHDATE: [birth year] , 22  yrs. old GENDER: Male ENDOSCOPIST: Iva Boop, MD, [facility] REFERRED BY:   Alysia Penna, MD PROCEDURE DATE:  01/18/2013 PROCEDURE:   Colonoscopy, diagnostic ASA CLASS:   Class II INDICATIONS:Rectal Bleeding and Iron Deficiency Anemia. MEDICATIONS: propofol (Diprivan) 300mg  IV, MAC sedation, administered by CRNA, and These medications were titrated to patient response per physician's verbal order  DESCRIPTION OF PROCEDURE:   After the risks benefits and alternatives of the procedure were thoroughly explained, informed consent was obtained.  A digital rectal exam revealed no rectal mass and A digital rectal exam revealed several skin tags.   The LB [id number]  endoscope was introduced through the anus and advanced to the terminal ileum which was intubated for a short distance. No adverse events experienced.   The quality of the prep was excellent, using MoviPrep  The instrument was then slowly withdrawn as the colon was fully examined.      COLON FINDINGS: Large internal hemorrhoids were found on insertion and withdrawal. appear to be RA, RP and LL.  A normal appearing cecum, ileocecal valve, and appendiceal orifice were identified. The ascending, hepatic flexure, transverse, splenic flexure, descending, sigmoid colon and proximal rectum appeared unremarkable.  No polyps or cancers were seen.  Retroflexed views revealed internal hemorrhoids. The time to cecum=2 minutes 14 seconds.  Withdrawal time=7 minutes 20 seconds.  The scope was withdrawn and the procedure completed. COMPLICATIONS: There were no complications.  ENDOSCOPIC IMPRESSION: 1.   Large internal hemorrhoids 2.   Normal colon - excellent prep  RECOMMENDATIONS: Hemorrhoids with chronic bleeding may be cause of iron-deficioency anemia. TTG  Ab to check for celiac is still pending (these have been delayed). We will call him to schedule an office follow-up to discuss hemorrhoid treatment.  handouts provided.   eSigned:  Iva Boop, MD, [facility] 01/18/2013 11:07 AM cc: The Patient, Alysia Penna, MD and Si Gaul, MD

## 2013-01-22 ENCOUNTER — Telehealth: Payer: Self-pay | Admitting: *Deleted

## 2013-01-22 ENCOUNTER — Telehealth: Payer: Self-pay

## 2013-01-22 NOTE — Telephone Encounter (Signed)
Left message for patient to call back  

## 2013-01-22 NOTE — Telephone Encounter (Signed)
Message copied by Annett Fabian on Tue Jan 22, 2013  3:19 PM ------      Message from: Iva Boop      Created: Fri Jan 18, 2013  2:06 PM      Regarding: appointment in next 1-2 weeks       Please add him on to the schedule sometime in next 1-2 weeks - I may get him started on hemorrhoid ligation             Want to see him in office and explain, etc ------

## 2013-01-22 NOTE — Telephone Encounter (Signed)
Patient is not able to come right now, requested an office visit in August.  He can't miss any more work at this time.  Office visit scheduled for 04/08/13 4:00

## 2013-01-22 NOTE — Telephone Encounter (Signed)
  Follow up Call-  Call back number 01/18/2013  Post procedure Call Back phone  # [phone]  Permission to leave phone message Yes     Patient questions:  Do you have a fever, pain , or abdominal swelling? no Pain Score  0 *  Have you tolerated food without any problems? yes  Have you been able to return to your normal activities? yes  Do you have any questions about your discharge instructions: Diet   no Medications  no Follow up visit  no  Do you have questions or concerns about your Care? yes  Actions: * If pain score is 4 or above: No action needed, pain <4.

## 2013-02-13 ENCOUNTER — Ambulatory Visit: Payer: 59 | Admitting: Internal Medicine

## 2013-02-19 ENCOUNTER — Other Ambulatory Visit (HOSPITAL_BASED_OUTPATIENT_CLINIC_OR_DEPARTMENT_OTHER): Payer: 59 | Admitting: Lab

## 2013-02-19 DIAGNOSIS — D509 Iron deficiency anemia, unspecified: Secondary | ICD-10-CM

## 2013-02-19 LAB — FERRITIN: Ferritin: 31 ng/mL (ref 22–322)

## 2013-02-19 LAB — CBC WITH DIFFERENTIAL/PLATELET
BASO%: 1 % (ref 0.0–2.0)
Basophils Absolute: 0 10e3/uL (ref 0.0–0.1)
EOS%: 0.5 % (ref 0.0–7.0)
Eosinophils Absolute: 0 10e3/uL (ref 0.0–0.5)
HCT: 37.7 % — ABNORMAL LOW (ref 38.4–49.9)
HGB: 11.9 g/dL — ABNORMAL LOW (ref 13.0–17.1)
LYMPH%: 49.9 % — ABNORMAL HIGH (ref 14.0–49.0)
MCH: 24 pg — ABNORMAL LOW (ref 27.2–33.4)
MCHC: 31.5 g/dL — ABNORMAL LOW (ref 32.0–36.0)
MCV: 76.2 fL — ABNORMAL LOW (ref 79.3–98.0)
MONO#: 0.6 10e3/uL (ref 0.1–0.9)
MONO%: 11.7 % (ref 0.0–14.0)
NEUT#: 1.8 10e3/uL (ref 1.5–6.5)
NEUT%: 36.9 % — ABNORMAL LOW (ref 39.0–75.0)
Platelets: 196 10e3/uL (ref 140–400)
RBC: 4.95 10e6/uL (ref 4.20–5.82)
RDW: 26.7 % — ABNORMAL HIGH (ref 11.0–14.6)
WBC: 5 10e3/uL (ref 4.0–10.3)
lymph#: 2.5 10e3/uL (ref 0.9–3.3)

## 2013-02-19 LAB — IRON AND TIBC
%SAT: 52 % (ref 20–55)
Iron: 233 ug/dL — ABNORMAL HIGH (ref 42–165)
TIBC: 448 ug/dL — ABNORMAL HIGH (ref 215–435)
UIBC: 215 ug/dL (ref 125–400)

## 2013-02-25 ENCOUNTER — Encounter: Payer: Self-pay | Admitting: Internal Medicine

## 2013-02-25 ENCOUNTER — Telehealth: Payer: Self-pay | Admitting: Internal Medicine

## 2013-02-25 ENCOUNTER — Ambulatory Visit (HOSPITAL_BASED_OUTPATIENT_CLINIC_OR_DEPARTMENT_OTHER): Payer: 59 | Admitting: Internal Medicine

## 2013-02-25 VITALS — BP 134/88 | HR 77 | Temp 96.8°F | Resp 18 | Ht 68.0 in | Wt 173.7 lb

## 2013-02-25 DIAGNOSIS — D509 Iron deficiency anemia, unspecified: Secondary | ICD-10-CM

## 2013-02-25 NOTE — Telephone Encounter (Signed)
gv and printed appt sched and avs for pt  °

## 2013-02-25 NOTE — Progress Notes (Signed)
[facility] Telephone:([phone]) [phone]   Fax:(336) [phone]  OFFICE PROGRESS NOTE  Alysia Penna, MD [address] [place] [address]  DIAGNOSIS: Iron deficiency anemia secondary to internal hemorrhoids.  PRIOR THERAPY: None  CURRENT THERAPY: Integra plus 1 capsule by mouth daily.  INTERVAL HISTORY: Dean Allen 22 y.o. male returns to the clinic today for followup visit accompanied by his mother. The patient is feeling much better today and has more energy and stamina since he was started on Integra plus. He denied having any significant fatigue or weakness. He denied having any bleeding issues. He had a colonoscopy under the care of Dr. Leone Payor last month and it showed internal hemorrhoids. The patient is tolerating his treatment with Integra plus fairly well. He had repeat CBC and iron study recently and he is here for evaluation and discussion of his lab results.  MEDICAL HISTORY: Past Medical History  Diagnosis Date  . Iron deficiency anemia   . Hemorrhoids     ALLERGIES:  has No Known Allergies.  MEDICATIONS:  Current Outpatient Prescriptions  Medication Sig Dispense Refill  . FeFum-FePoly-FA-B Cmp-C-Biot (INTEGRA PLUS) CAPS Take 1 capsule by mouth every morning.  30 capsule  2   No current facility-administered medications for this visit.    SURGICAL HISTORY:  Past Surgical History  Procedure Laterality Date  . Appendectomy      REVIEW OF SYSTEMS:  A comprehensive review of systems was negative.   PHYSICAL EXAMINATION: General appearance: alert, cooperative and no distress Head: Normocephalic, without obvious abnormality, atraumatic Neck: no adenopathy Lymph nodes: Cervical, supraclavicular, and axillary nodes normal. Resp: clear to auscultation bilaterally Cardio: regular rate and rhythm, S1, S2 normal, no murmur, click, rub or gallop GI: soft, non-tender; bowel sounds normal; no masses,  no organomegaly Extremities: extremities  normal, atraumatic, no cyanosis or edema  ECOG PERFORMANCE STATUS: 0 - Asymptomatic  Blood pressure 134/88, pulse 77, temperature 96.8 F (36 C), temperature source Oral, resp. rate 18, height 5\' 8"  (1.727 m), weight 173 lb 11.2 oz (78.79 kg).  LABORATORY DATA: Lab Results  Component Value Date   WBC 5.0 02/19/2013   HGB 11.9* 02/19/2013   HCT 37.7* 02/19/2013   MCV 76.2* 02/19/2013   PLT 196 02/19/2013      Chemistry      Component Value Date/Time   NA 139 01/14/2013 1546   NA 139 01/15/2009 0838   K 4.1 01/14/2013 1546   K 4.0 01/15/2009 0838   CL 103 01/14/2013 1546   CL 102 01/15/2009 0838   CO2 27 01/14/2013 1546   CO2 29 01/15/2009 0838   BUN 11.1 01/14/2013 1546   BUN 12 01/15/2009 0838   CREATININE 1.2 01/14/2013 1546   CREATININE 1.1 01/15/2009 0838      Component Value Date/Time   CALCIUM 9.3 01/14/2013 1546   CALCIUM 9.6 01/15/2009 0838   ALKPHOS 53 01/14/2013 1546   ALKPHOS 85 01/15/2009 0838   AST 19 01/14/2013 1546   AST 23 01/15/2009 0838   ALT 14 01/14/2013 1546   ALT 20 01/15/2009 0838   BILITOT 0.60 01/14/2013 1546   BILITOT 0.8 01/15/2009 0838       RADIOGRAPHIC STUDIES: No results found.  ASSESSMENT AND PLAN: This is a very pleasant 22 years old [place] American male with history of deficiency anemia secondary to internal hemorrhoids. The patient was started on treatment with Integra plus and he has significant improvement in his hemoglobin and hematocrit as well as  the iron study. I recommended for him to continue on Integra plus 1 capsule by mouth daily. I was him back for followup visit in 3 months with repeat CBC and iron study. He was advised to call immediately if he has any concerning symptoms in the interval.  All questions were answered. The patient knows to call the clinic with any problems, questions or concerns. We can certainly see the patient much sooner if necessary.

## 2013-02-25 NOTE — Patient Instructions (Signed)
Continue on Integra plus. Followup visit in 3 months with repeat CBC and iron study

## 2013-03-20 ENCOUNTER — Encounter: Payer: Self-pay | Admitting: *Deleted

## 2013-04-08 ENCOUNTER — Ambulatory Visit: Payer: 59 | Admitting: Internal Medicine

## 2013-05-16 ENCOUNTER — Telehealth: Payer: Self-pay | Admitting: Medical Oncology

## 2013-05-16 NOTE — Telephone Encounter (Signed)
Had labs done at work and he wil bring them by for next appt. I cancelled lab on 9/23.

## 2013-05-21 ENCOUNTER — Other Ambulatory Visit: Payer: 59 | Admitting: Lab

## 2013-05-21 ENCOUNTER — Telehealth: Payer: Self-pay | Admitting: *Deleted

## 2013-05-21 NOTE — Telephone Encounter (Signed)
Lab work from [employer] [facility] dated 04/25/13 given to Dr Donnald Garre to review.  SLJ

## 2013-05-28 ENCOUNTER — Ambulatory Visit: Payer: 59 | Admitting: Internal Medicine

## 2013-05-28 ENCOUNTER — Telehealth: Payer: Self-pay | Admitting: Internal Medicine

## 2013-05-28 NOTE — Telephone Encounter (Signed)
pt wanted to cx and call back to r/s

## 2014-01-15 ENCOUNTER — Other Ambulatory Visit: Payer: Self-pay | Admitting: Internal Medicine

## 2014-03-22 ENCOUNTER — Encounter (HOSPITAL_COMMUNITY): Payer: Self-pay | Admitting: Emergency Medicine

## 2014-03-22 ENCOUNTER — Emergency Department (INDEPENDENT_AMBULATORY_CARE_PROVIDER_SITE_OTHER)
Admission: EM | Admit: 2014-03-22 | Discharge: 2014-03-22 | Disposition: A | Discharge status: Home / Self Care | Payer: 59 | Source: Home / Self Care

## 2014-03-22 DIAGNOSIS — K644 Residual hemorrhoidal skin tags: Secondary | ICD-10-CM

## 2014-03-22 DIAGNOSIS — K645 Perianal venous thrombosis: Secondary | ICD-10-CM

## 2014-03-22 MED ORDER — HYDROCODONE-ACETAMINOPHEN 5-325 MG PO TABS: 1.0000 | ORAL_TABLET | ORAL | Status: DC | PRN

## 2014-03-22 MED ORDER — LIDOCAINE-HYDROCORTISONE ACE 3-0.5 % RE CREA: TOPICAL_CREAM | RECTAL | Status: DC

## 2014-03-22 NOTE — ED Provider Notes (Signed)
CSN: [id number]     Arrival date & time 03/22/14  1133 History   First MD Initiated Contact with Patient 03/22/14 1218     Chief Complaint  Patient presents with  . Hemorrhoids   (Consider location/radiation/quality/duration/timing/severity/associated sxs/prior Treatment) HPI Comments: Have had hemorrhoids all  My life. Now with moderate to severe pain and bleeding.    Past Medical History  Diagnosis Date  . Iron deficiency anemia   . Hemorrhoids    Past Surgical History  Procedure Laterality Date  . Appendectomy    . Colonoscopy      01/18/13   Family History  Problem Relation Age of Onset  . Glaucoma Maternal Grandfather   . Arthritis Maternal Grandfather   . Hypertension Mother   . Alcoholism Father   . Breast cancer Maternal Grandmother   . Colon cancer Neg Hx   . Esophageal cancer Neg Hx   . Rectal cancer Neg Hx   . Stomach cancer Neg Hx    History  Substance Use Topics  . Smoking status: Former Games developermoker  . Smokeless tobacco: Never Used     Comment: started in 2010  . Alcohol Use: Yes     Comment: daily mixed drink    Review of Systems  Constitutional: Positive for activity change.  Gastrointestinal: Negative.   Skin: Negative for rash.    Allergies  Review of patient's allergies indicates no known allergies.  Home Medications   Prior to Admission medications   Medication Sig Start Date End Date Taking? Authorizing Provider  FeFum-FePoly-FA-B Cmp-C-Biot (INTEGRA PLUS) CAPS TAKE 1 CAPSULE BY MOUTH EVERY MORNING. 01/15/14  Yes Si GaulMohamed Mohamed, MD  HYDROcodone-acetaminophen (NORCO/VICODIN) 5-325 MG per tablet Take 1 tablet by mouth every 4 (four) hours as needed. 03/22/14   Hayden Rasmussenavid Jaevion Goto, NP  lidocaine-hydrocortisone [facility]NAMANTEL HC) 3-0.5 % CREA One application to rectum tid 03/22/14   Hayden Rasmussenavid Kindle Strohmeier, NP   BP 142/87  Pulse 80  Temp(Src) 97.5 F (36.4 C) (Oral)  SpO2 96% Physical Exam  Nursing note and vitals reviewed. Constitutional: He is oriented to person,  place, and time. He appears well-developed and well-nourished. No distress.  Neck: Normal range of motion. Neck supple.  Pulmonary/Chest: Effort normal.  Genitourinary:  Large thrombosed exterior hemorrhoid, Bleeding. Exquisite tenderness.   Neurological: He is alert and oriented to person, place, and time. He exhibits normal muscle tone.  Skin: Skin is warm and dry.  Psychiatric: He has a normal mood and affect.    ED Course  Procedures (including critical care time) Labs Review Labs Reviewed - No data to display  Imaging Review No results found.   MDM   1. External hemorrhoid, thrombosed   2. External hemorrhoid, bleeding     After explaining the procedure the pt declined due to the pain and severe tenderness. I offered to refer to surgeon and he accepted. Pt wants general anesthesia or concious sedation      Hayden Rasmussenavid Taniya Dasher, NP 03/22/14 1248

## 2014-03-22 NOTE — ED Provider Notes (Signed)
Medical screening examination/treatment/procedure(s) were performed by a resident physician or non-physician practitioner and as the supervising physician I was immediately available for consultation/collaboration.  Millan Legan, MD Family Medicine   Jamiel Goncalves J Mackay Hanauer, MD 03/22/14 1927 

## 2014-03-22 NOTE — ED Notes (Signed)
States he has had problems with external hemorrhoids "my whole life".  Usually uses Preparation H until flare-up resolved.  Has not had surgery for hemorrhoids.  Now having a flare-up with severe pain and "a lot" of bleeding - pt wearing an adult diaper due to bleeding.

## 2014-03-22 NOTE — Discharge Instructions (Signed)
Hemorrhoids °Hemorrhoids are swollen veins around the rectum or anus. There are two types of hemorrhoids:  °· Internal hemorrhoids. These occur in the veins just inside the rectum. They may poke through to the outside and become irritated and painful. °· External hemorrhoids. These occur in the veins outside the anus and can be felt as a painful swelling or hard lump near the anus. °CAUSES °· Pregnancy.   °· Obesity.   °· Constipation or diarrhea.   °· Straining to have a bowel movement.   °· Sitting for long periods on the toilet. °· Heavy lifting or other activity that caused you to strain. °· Anal intercourse. °SYMPTOMS  °· Pain.   °· Anal itching or irritation.   °· Rectal bleeding.   °· Fecal leakage.   °· Anal swelling.   °· One or more lumps around the anus.   °DIAGNOSIS  °Your caregiver may be able to diagnose hemorrhoids by visual examination. Other examinations or tests that may be performed include:  °· Examination of the rectal area with a gloved hand (digital rectal exam).   °· Examination of anal canal using a small tube (scope).   °· A blood test if you have lost a significant amount of blood. °· A test to look inside the colon (sigmoidoscopy or colonoscopy). °TREATMENT °Most hemorrhoids can be treated at home. However, if symptoms do not seem to be getting better or if you have a lot of rectal bleeding, your caregiver may perform a procedure to help make the hemorrhoids get smaller or remove them completely. Possible treatments include:  °· Placing a rubber band at the base of the hemorrhoid to cut off the circulation (rubber band ligation).   °· Injecting a chemical to shrink the hemorrhoid (sclerotherapy).   °· Using a tool to burn the hemorrhoid (infrared light therapy).   °· Surgically removing the hemorrhoid (hemorrhoidectomy).   °· Stapling the hemorrhoid to block blood flow to the tissue (hemorrhoid stapling).   °HOME CARE INSTRUCTIONS  °· Eat foods with fiber, such as whole grains, beans,  nuts, fruits, and vegetables. Ask your doctor about taking products with added fiber in them (fiber supplements). °· Increase fluid intake. Drink enough water and fluids to keep your urine clear or pale yellow.   °· Exercise regularly.   °· Go to the bathroom when you have the urge to have a bowel movement. Do not wait.   °· Avoid straining to have bowel movements.   °· Keep the anal area dry and clean. Use wet toilet paper or moist towelettes after a bowel movement.   °· Medicated creams and suppositories may be used or applied as directed.   °· Only take over-the-counter or prescription medicines as directed by your caregiver.   °· Take warm sitz baths for 15-20 minutes, 3-4 times a day to ease pain and discomfort.   °· Place ice packs on the hemorrhoids if they are tender and swollen. Using ice packs between sitz baths may be helpful.   °· Put ice in a plastic bag.   °· Place a towel between your skin and the bag.   °· Leave the ice on for 15-20 minutes, 3-4 times a day.   °· Do not use a donut-shaped pillow or sit on the toilet for long periods. This increases blood pooling and pain.   °SEEK MEDICAL CARE IF: °· You have increasing pain and swelling that is not controlled by treatment or medicine. °· You have uncontrolled bleeding. °· You have difficulty or you are unable to have a bowel movement. °· You have pain or inflammation outside the area of the hemorrhoids. °MAKE SURE YOU: °· Understand these instructions. °·   Will watch your condition.  Will get help right away if you are not doing well or get worse. Document Released: 08/12/2000 Document Revised: 08/01/2012 Document Reviewed: 06/19/2012 [facility] Patient Information 2015 [place], [place]. This information is not intended to replace advice given to you by your health care provider. Make sure you discuss any questions you have with your health care provider.   Disposable Sitz Bath A disposable sitz bath is a plastic basin that fits over the toilet.  A bag is hung above the toilet and is connected to a tube that opens into the disposable sitz bath. The bag is filled with warm water that can flow into the basin through the tube.  HOW TO USE A DISPOSABLE SITZ BATH 1. Close the clamp on the tubing before filling the bag with water. This is to prevent leakage. 2. Fill the sitz bath basin and the plastic bag with warm water. 3. Place the filled basin on the toilet with the seat raised. Make sure the overflow opening is facing toward the back of the toilet. 4. Hang the filled plastic bag overhead on a hook or towel rack close to the toilet. When the bag is unclamped, a steady stream of water will flow from the bag, through the tubing, and into the basin. 5. Attach the tubing to the opening on the basin. 6. Sit on the basin positioned on the toilet seat and release the clamp. This will allow warm water to flush the area around your genitals and anus (perineum). 7. Remain sitting on the basin for approximately 15 to 20 minutes. 8. Stand up and pat the perineum area dry. If needed, apply clean bandages (dressings) to the affected area. 9. Tip the basin into the toilet to remove any remaining water and flush the toilet. 10. Wash the basin with warm water and soap. Let it dry in the sink. 11. Store the basin and tubing in a clean, dry area. 12. Wash your hands with soap and water. SEEK MEDICAL CARE IF: You get worse instead of better. Stop the sitz baths if you get worse. MAKE SURE YOU:  Understand these instructions.  Will watch your condition.  Will get help right away if you are not doing well or get worse. Document Released: 02/14/2012 Document Revised: 05/09/2012 Document Reviewed: 02/14/2012 [facility] Patient Information 2015 [place], [place]. This information is not intended to replace advice given to you by your health care provider. Make sure you discuss any questions you have with your health care  provider.  Hemorrhoidectomy Hemorrhoidectomy is surgery to remove hemorrhoids. Hemorrhoids are veins that have become swollen in the rectum. The rectum is the area from the bottom end of the intestines to the opening where bowel movements leave the body. Hemorrhoids can be uncomfortable. They can cause itching, bleeding and pain if a blood clot forms in them (thrombose). If hemorrhoids are small, surgery may not be needed. But if they cover a larger area, surgery is usually suggested.  LET YOUR CAREGIVER KNOW ABOUT:   Any allergies.  All medications you are taking, including:  Herbs, eyedrops, over-the-counter medications and creams.  Blood thinners (anticoagulants), aspirin or other drugs that could affect blood clotting.  Use of steroids (by mouth or as creams).  Previous problems with anesthetics, including local anesthetics.  Possibility of pregnancy, if this applies.  Any history of blood clots.  Any history of bleeding or other blood problems.  Previous surgery.  Smoking history.  Other health problems. RISKS AND COMPLICATIONS All surgery  carries some risk. However, hemorrhoid surgery usually goes smoothly. Possible complications could include:  Urinary retention.  Bleeding.  Infection.  A painful incision.  A reaction to the anesthesia (this is not common). BEFORE THE PROCEDURE   Stop using aspirin and non-steroidal anti-inflammatory drugs (NSAIDs) for pain relief. This includes prescription drugs and over-the-counter drugs such as ibuprofen and naproxen. Also stop taking vitamin E. If possible, do this two weeks before your surgery.  If you take blood-thinners, ask your healthcare provider when you should stop taking them.  You will probably have blood and urine tests done several days before your surgery.  Do not eat or drink for about 8 hours before the surgery.  Arrive at least an hour before the surgery, or whenever your surgeon recommends. This will  give you time to check in and fill out any needed paperwork.  Hemorrhoidectomy is often an outpatient procedure. This means you will be able to go home the same day. Sometimes, though, people stay overnight in the hospital after the procedure. Ask your surgeon what to expect. Either way, make arrangements in advance for someone to drive you home. PROCEDURE   The preparation:  You will change into a hospital gown.  You will be given an IV. A needle will be inserted in your arm. Medication can flow directly into your body through this needle.  You might be given an enema to clear your rectum.  Once in the operating room, you will probably lie on your side or be repositioned later to lying on your stomach.  You will be given anesthesia (medication) so you will not feel anything during the surgery. The surgery often is done with local anesthesia (the area near the hemorrhoids will be numb and you will be drowsy but awake). Sometimes, general anesthesia is used (you will be asleep during the procedure).  The procedure:  There are a few different procedures for hemorrhoids. Be sure to ask you surgeon about the procedure, the risks and benefits.  Be sure to ask about what you need to do to take care of the wound, if there is one. AFTER THE PROCEDURE  You will stay in a recovery area until the anesthesia has worn off. Your blood pressure and pulse will be checked every so often.  You may feel a lot of pain in the area of the rectum.  Take all pain medication prescribed by your surgeon. Ask before taking any over-the-counter pain medicines.  Sometimes sitting in a warm bath can help relieve your pain.  To make sure you have bowel movements without straining:  You will probably need to take stool softeners (usually a pill) for a few days.  You should drink 8 to 10 glasses of water each day.  Your activity will be restricted for awhile. Ask your caregiver for a list of what you should and  should not do while you recover. Document Released: 06/12/2009 Document Revised: 11/07/2011 Document Reviewed: 06/12/2009 [facility] Patient Information 2015 [place], [place]. This information is not intended to replace advice given to you by your health care provider. Make sure you discuss any questions you have with your health care provider.

## 2014-03-25 ENCOUNTER — Encounter (INDEPENDENT_AMBULATORY_CARE_PROVIDER_SITE_OTHER): Payer: Self-pay | Admitting: Surgery

## 2014-03-25 ENCOUNTER — Ambulatory Visit (INDEPENDENT_AMBULATORY_CARE_PROVIDER_SITE_OTHER): Payer: 59 | Admitting: Surgery

## 2014-03-25 VITALS — BP 126/82 | HR 72 | Temp 97.5°F | Ht 67.0 in | Wt 173.0 lb

## 2014-03-25 DIAGNOSIS — K645 Perianal venous thrombosis: Secondary | ICD-10-CM

## 2014-03-25 NOTE — Progress Notes (Signed)
Subjective:     Patient ID: Dean Allen, male   DOB: 08/15/1991, 23 y.o.   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 He had a lot of bleeding and now after preparation H. And steroid cream he is feeling better. He reports that he has had problems with hemorrhoids most of his life. Of occasional bleeding. Over the weekend, he developed a painful area after being stressed. Again it is now improved.  Review of Systems     Objective:   Physical Exam On exam, he has external skin tag and consistent with hemorrhoids were once swollen. He could tell where he had had one thrombosed hemorrhoid which is now self ruptured. Because of his tenderness, I could not do a digital exam    Assessment:     Thrombosed external hemorrhoid     Plan:     I discussed the diagnosis with him in detail. I recommend he continue the MiraLAX, sitz bath, and steroid cream. If he improves, he can see us back as needed. If he fails to improve, he can come back and we can consider hemorrhoidectomy

## 2014-03-25 NOTE — Patient Instructions (Signed)

## 2014-05-24 ENCOUNTER — Emergency Department (INDEPENDENT_AMBULATORY_CARE_PROVIDER_SITE_OTHER)
Admission: EM | Admit: 2014-05-24 | Discharge: 2014-05-24 | Disposition: A | Discharge status: Home / Self Care | Payer: 59 | Source: Home / Self Care | Attending: Family Medicine | Admitting: Family Medicine

## 2014-05-24 ENCOUNTER — Encounter (HOSPITAL_COMMUNITY): Payer: Self-pay | Admitting: Emergency Medicine

## 2014-05-24 DIAGNOSIS — S61409A Unspecified open wound of unspecified hand, initial encounter: Secondary | ICD-10-CM

## 2014-05-24 DIAGNOSIS — S61411A Laceration without foreign body of right hand, initial encounter: Secondary | ICD-10-CM

## 2014-05-24 DIAGNOSIS — Z23 Encounter for immunization: Secondary | ICD-10-CM

## 2014-05-24 MED ORDER — LIDOCAINE HCL (PF) 2 % IJ SOLN
INTRAMUSCULAR | Status: AC
  Filled 2014-05-24: qty 2

## 2014-05-24 MED ORDER — TETANUS-DIPHTH-ACELL PERTUSSIS 5-2.5-18.5 LF-MCG/0.5 IM SUSP
0.5000 mL | Freq: Once | INTRAMUSCULAR | Status: AC
  Administered 2014-05-24: 0.5 mL via INTRAMUSCULAR

## 2014-05-24 MED ORDER — TETANUS-DIPHTH-ACELL PERTUSSIS 5-2.5-18.5 LF-MCG/0.5 IM SUSP
INTRAMUSCULAR | Status: AC
  Filled 2014-05-24: qty 0.5

## 2014-05-24 MED ORDER — POVIDONE-IODINE 10 % EX SOLN
CUTANEOUS | Status: AC
  Filled 2014-05-24: qty 118

## 2014-05-24 NOTE — Discharge Instructions (Signed)

## 2014-05-24 NOTE — ED Provider Notes (Signed)
Medical screening examination/treatment/procedure(s) were performed by resident physician or non-physician practitioner and as supervising physician I was immediately available for consultation/collaboration.   KINDL,JAMES DOUGLAS MD.   James D Kindl, MD 05/24/14 1432 

## 2014-05-24 NOTE — ED Notes (Signed)
PT SUSTAINED  A  LACERATION  TO  THE L  HAND  BETWEEN  FINGERS  BLEEDING  HAS  SUBSIDED       UNKNOWN  AS  TO  HIS  LAST  TETANUS  SHOT     THE    INJURY      WAS    OCCURRED  WITH A  KNIFE

## 2014-05-24 NOTE — ED Provider Notes (Signed)
CSN: [id number]     Arrival date & time 05/24/14  1155 History   First MD Initiated Contact with Patient 05/24/14 1208     Chief Complaint  Patient presents with  . Laceration   (Consider location/radiation/quality/duration/timing/severity/associated sxs/prior Treatment) HPI   23 year old male presents for evaluation of a laceration between the index and third fingers on his left hand. He was using a pocket knife when it slipped and cut between his fingers. It was bleeding a lot but the bleeding has subsided with direct pressure. This occurred couple of hours ago. No numbness distal to this. She is able to grip strongly without difficulty or pain. No systemic symptoms. Tetanus is not up-to-date  Past Medical History  Diagnosis Date  . Iron deficiency anemia   . Hemorrhoids    Past Surgical History  Procedure Laterality Date  . Appendectomy    . Colonoscopy      01/18/13   Family History  Problem Relation Age of Onset  . Glaucoma Maternal Grandfather   . Arthritis Maternal Grandfather   . Hypertension Mother   . Alcoholism Father   . Breast cancer Maternal Grandmother   . Colon cancer Neg Hx   . Esophageal cancer Neg Hx   . Rectal cancer Neg Hx   . Stomach cancer Neg Hx    History  Substance Use Topics  . Smoking status: Former Games developer  . Smokeless tobacco: Never Used     Comment: started in 2010  . Alcohol Use: Yes     Comment: daily mixed drink    Review of Systems  Skin: Positive for wound.  All other systems reviewed and are negative.   Allergies  Review of patient's allergies indicates no known allergies.  Home Medications   Prior to Admission medications   Medication Sig Start Date End Date Taking? Authorizing Provider  FeFum-FePoly-FA-B Cmp-C-Biot (INTEGRA PLUS) CAPS TAKE 1 CAPSULE BY MOUTH EVERY MORNING. 01/15/14   Si Gaul, MD  HYDROcodone-acetaminophen (NORCO/VICODIN) 5-325 MG per tablet Take 1 tablet by mouth every 4 (four) hours as needed.  03/22/14   Hayden Rasmussen, NP  lidocaine-hydrocortisone [employer]) 3-0.5 % CREA One application to rectum tid 03/22/14   Hayden Rasmussen, NP   BP 139/76  Pulse 78  Temp(Src) 98 F (36.7 C) (Oral)  Resp 20  SpO2 97% Physical Exam  Nursing note and vitals reviewed. Constitutional: He is oriented to person, place, and time. He appears well-developed and well-nourished. No distress.  HENT:  Head: Normocephalic.  Pulmonary/Chest: Effort normal. No respiratory distress.  Musculoskeletal:       Left hand: He exhibits laceration (2 cm laceration along the medial base of the index finger.). He exhibits normal capillary refill. Normal sensation noted.  Neurological: He is alert and oriented to person, place, and time. Coordination normal.  Skin: Skin is warm and dry. No rash noted. He is not diaphoretic.  Psychiatric: He has a normal mood and affect. Judgment normal.    ED Course  LACERATION REPAIR Date/Time: 05/24/2014 12:59 PM Performed by: Autumn Messing, H Authorized by: Bradd Canary D Consent: Verbal consent obtained. Risks and benefits: risks, benefits and alternatives were discussed Consent given by: patient Patient identity confirmed: verbally with patient Time out: Immediately prior to procedure a "time out" was called to verify the correct patient, procedure, equipment, support staff and site/side marked as required. Body area: upper extremity Location details: right hand Laceration length: 2 cm Foreign bodies: no foreign bodies Tendon involvement: none Nerve involvement: none Anesthesia:  local infiltration Local anesthetic: lidocaine 2% without epinephrine Anesthetic total: 5 ml Preparation: Patient was prepped and draped in the usual sterile fashion. Irrigation solution: saline Irrigation method: syringe Amount of cleaning: extensive Debridement: none Degree of undermining: none Skin closure: 5-0 Prolene Number of sutures: 3 Technique: simple Approximation:  loose Approximation difficulty: complex Dressing: antibiotic ointment and non-adhesive packing strip Patient tolerance: Patient tolerated the procedure well with no immediate complications.   (including critical care time) Labs Review Labs Reviewed - No data to display  Imaging Review No results found.   MDM   1. Laceration of hand, right, initial encounter    Repaired with 3 simple interrupted sutures. Followup in one week for suture removal. Watch for signs of infection.   Meds ordered this encounter  Medications  . Tdap (BOOSTRIX) injection 0.5 mL    Sig:       Graylon Good, PA-C 05/24/14 1330

## 2018-09-07 DIAGNOSIS — Z114 Encounter for screening for human immunodeficiency virus [HIV]: Secondary | ICD-10-CM | POA: Diagnosis not present

## 2018-09-07 DIAGNOSIS — Z0000 Encounter for general adult medical examination without abnormal findings: Secondary | ICD-10-CM | POA: Diagnosis not present

## 2018-09-07 DIAGNOSIS — D509 Iron deficiency anemia, unspecified: Secondary | ICD-10-CM | POA: Diagnosis not present

## 2019-03-21 DIAGNOSIS — F39 Unspecified mood [affective] disorder: Secondary | ICD-10-CM | POA: Diagnosis not present

## 2019-03-28 DIAGNOSIS — F39 Unspecified mood [affective] disorder: Secondary | ICD-10-CM | POA: Diagnosis not present

## 2019-05-22 ENCOUNTER — Ambulatory Visit (HOSPITAL_COMMUNITY)
Admission: EM | Admit: 2019-05-22 | Discharge: 2019-05-22 | Disposition: A | Discharge status: Home / Self Care | Payer: BC Managed Care – PPO | Attending: Family Medicine | Admitting: Family Medicine

## 2019-05-22 ENCOUNTER — Encounter (HOSPITAL_COMMUNITY): Payer: Self-pay | Admitting: Emergency Medicine

## 2019-05-22 ENCOUNTER — Other Ambulatory Visit: Payer: Self-pay

## 2019-05-22 DIAGNOSIS — K645 Perianal venous thrombosis: Secondary | ICD-10-CM | POA: Diagnosis not present

## 2019-05-22 MED ORDER — HYDROCODONE-ACETAMINOPHEN 5-325 MG PO TABS: 2.0000 | ORAL_TABLET | ORAL | 0 refills | Status: DC | PRN

## 2019-05-22 MED ORDER — HYDROCODONE-ACETAMINOPHEN 5-325 MG PO TABS
1.0000 | ORAL_TABLET | Freq: Once | ORAL | Status: AC
  Administered 2019-05-22: 1 via ORAL

## 2019-05-22 MED ORDER — HYDROCODONE-ACETAMINOPHEN 5-325 MG PO TABS
ORAL_TABLET | ORAL | Status: AC
  Filled 2019-05-22: qty 1

## 2019-05-22 NOTE — ED Triage Notes (Signed)
Pt presents to [facility] for assessment of rectal pain with a hx of hemorrhoids x 1 day.  Patient c/o blood in bowl after bowel movements.

## 2019-05-22 NOTE — Discharge Instructions (Signed)
You need to follow up with [facility] [place] [facility]. Go there now  Hydrocodone for pain.

## 2019-05-22 NOTE — ED Notes (Signed)
Patient able to ambulate independently  

## 2019-05-23 NOTE — ED Provider Notes (Signed)
Dean Allen    CSN: [id number] Arrival date & time: 05/22/19  1227      History   Chief Complaint Chief Complaint  Patient presents with  . Rectal Pain    HPI Dean Allen is a 28 y.o. male.   Pt is a 28 year old male with past medical history of hemorrhoids, anemia.  He presents today with a very painful hemorrhoid that is bleeding.  This is been present x1 day.  Started after a large bowel movement.  Bright red bleeding.  No abdominal pain, nausea or vomiting.  No foreign bodies or injuries to the rectum.  Rating pain 10 out of 10.  He took ibuprofen and Tylenol with no relief.  ROS per HPI      Past Medical History:  Diagnosis Date  . Hemorrhoids   . Iron deficiency anemia     Patient Active Problem List   Diagnosis Date Noted  . Iron deficiency anemia 01/09/2013  . Lower gastrointestinal bleed 01/09/2013  . SORE THROAT 01/26/2010  . EXTERNAL HEMORRHOIDS WITHOUT MENTION COMP 11/20/2009  . RHINITIS 02/04/2009  . HEADACHE 01/15/2009    Past Surgical History:  Procedure Laterality Date  . APPENDECTOMY    . COLONOSCOPY     01/18/13       Home Medications    Prior to Admission medications   Medication Sig Start Date End Date Taking? Authorizing Provider  FeFum-FePoly-FA-B Cmp-C-Biot (INTEGRA PLUS) CAPS TAKE 1 CAPSULE BY MOUTH EVERY MORNING. 01/15/14   Curt Bears, MD  HYDROcodone-acetaminophen (NORCO/VICODIN) 5-325 MG tablet Take 2 tablets by mouth every 4 (four) hours as needed. 05/22/19   Orvan July, NP  lidocaine-hydrocortisone ([employer]) 9-3.2 % CREA One application to rectum tid 03/22/14   Janne Napoleon, NP    Family History Family History  Problem Relation Age of Onset  . Glaucoma Maternal Grandfather   . Arthritis Maternal Grandfather   . Hypertension Mother   . Alcoholism Father   . Breast cancer Maternal Grandmother   . Colon cancer Neg Hx   . Esophageal cancer Neg Hx   . Rectal cancer Neg Hx   . Stomach cancer Neg Hx      Social History Social History   Tobacco Use  . Smoking status: Former Research scientist (life sciences)  . Smokeless tobacco: Never Used  . Tobacco comment: started in 2010  Substance Use Topics  . Alcohol use: Yes    Comment: daily mixed drink  . Drug use: No     Allergies   Patient has no known allergies.   Review of Systems Review of Systems   Physical Exam Triage Vital Signs ED Triage Vitals  Enc Vitals Group     BP 05/22/19 1256 138/90     Pulse Rate 05/22/19 1256 74     Resp 05/22/19 1256 18     Temp 05/22/19 1256 97.8 F (36.6 C)     Temp Source 05/22/19 1256 Tympanic     SpO2 05/22/19 1256 98 %     Weight --      Height --      Head Circumference --      Peak Flow --      Pain Score 05/22/19 1258 10     Pain Loc --      Pain Edu? --      Excl. in [place]? --    No data found.  Updated Vital Signs BP 138/90 (BP Location: Right Arm)   Pulse 74   Temp 97.8 F (  36.6 C) (Tympanic)   Resp 18   SpO2 98%   Visual Acuity Right Eye Distance:   Left Eye Distance:   Bilateral Distance:    Right Eye Near:   Left Eye Near:    Bilateral Near:     Physical Exam Vitals signs and nursing note reviewed.  Constitutional:      General: He is not in acute distress.    Appearance: He is not ill-appearing, toxic-appearing or diaphoretic.     Comments: Appears in pain   HENT:     Head: Normocephalic and atraumatic.     Nose: Nose normal.  Eyes:     Conjunctiva/sclera: Conjunctivae normal.  Neck:     Musculoskeletal: Normal range of motion.  Pulmonary:     Effort: Pulmonary effort is normal.  Abdominal:     Palpations: Abdomen is soft.     Tenderness: There is no abdominal tenderness.  Genitourinary:    Comments: Large hemorrhoid to rectum actively bleeding.  Approximately 2 cm with multiple thrombosis Very painful to touch Musculoskeletal: Normal range of motion.  Skin:    General: Skin is warm and dry.  Neurological:     Mental Status: He is alert.  Psychiatric:         Mood and Affect: Mood normal.      UC Treatments / Results  Labs (all labs ordered are listed, but only abnormal results are displayed) Labs Reviewed - No data to display  EKG   Radiology No results found.  Procedures Procedures (including critical care time)  Medications Ordered in UC Medications  HYDROcodone-acetaminophen (NORCO/VICODIN) 5-325 MG per tablet 1 tablet (1 tablet Oral Given 05/22/19 1353)  HYDROcodone-acetaminophen (NORCO/VICODIN) 5-325 MG per tablet (has no administration in time range)    Initial Impression / Assessment and Plan / UC Course  I have reviewed the triage vital signs and the nursing notes.  Pertinent labs & imaging results that were available during my care of the patient were reviewed by me and considered in my medical decision making (see chart for details).     Patient is a 28 year old male with a very large thrombosed hemorrhoid that is actively bleeding. Norco given for pain here in clinic.  Sending patient to [facility] surgery for further management Prescription given for pain medicine Final Clinical Impressions(s) / UC Diagnoses   Final diagnoses:  Thrombosed hemorrhoids     Discharge Instructions     You need to follow up with [facility]. Go there now  Hydrocodone for pain.     ED Prescriptions    Medication Sig Dispense Auth. Provider   HYDROcodone-acetaminophen (NORCO/VICODIN) 5-325 MG tablet Take 2 tablets by mouth every 4 (four) hours as needed. 10 tablet Arvel Oquinn A, NP     I have reviewed the PDMP during this encounter.   Dahlia Byes A, NP 05/23/19 [phone]

## 2019-05-27 DIAGNOSIS — K645 Perianal venous thrombosis: Secondary | ICD-10-CM | POA: Diagnosis not present

## 2019-06-17 DIAGNOSIS — K645 Perianal venous thrombosis: Secondary | ICD-10-CM | POA: Diagnosis not present

## 2020-04-28 ENCOUNTER — Ambulatory Visit: Payer: Self-pay | Attending: Internal Medicine

## 2020-04-28 DIAGNOSIS — Z23 Encounter for immunization: Secondary | ICD-10-CM

## 2020-04-28 NOTE — Progress Notes (Signed)
   Covid-19 Vaccination Clinic  Name:  Dean Allen    MRN: [id number] DOB: [birth year]  04/28/2020  Mr. [name] was observed post Covid-19 immunization for 15 minutes without incident. He was provided with Vaccine Information Sheet and instruction to access the V-Safe system.   Mr. [name] was instructed to call 911 with any severe reactions post vaccine: Marland Kitchen Difficulty breathing  . Swelling of face and throat  . A fast heartbeat  . A bad rash all over body  . Dizziness and weakness   Immunizations Administered    Name Date Dose VIS Date Route   Pfizer COVID-19 Vaccine 04/28/2020  2:03 PM 0.3 mL 10/23/2018 Intramuscular   Manufacturer: [employer], [employer]   Lot: [id number]   NDC: [address]-0

## 2022-08-29 DIAGNOSIS — F5221 Male erectile disorder: Secondary | ICD-10-CM | POA: Insufficient documentation

## 2022-09-26 ENCOUNTER — Ambulatory Visit: Payer: No Typology Code available for payment source | Admitting: Internal Medicine

## 2022-10-13 ENCOUNTER — Ambulatory Visit: Payer: No Typology Code available for payment source | Admitting: Internal Medicine

## 2022-10-13 ENCOUNTER — Encounter: Payer: Self-pay | Admitting: Internal Medicine

## 2022-10-13 VITALS — BP 110/80 | HR 62 | Temp 97.9°F | Ht 67.0 in | Wt 167.2 lb

## 2022-10-13 DIAGNOSIS — Z119 Encounter for screening for infectious and parasitic diseases, unspecified: Secondary | ICD-10-CM | POA: Diagnosis not present

## 2022-10-13 DIAGNOSIS — F524 Premature ejaculation: Secondary | ICD-10-CM | POA: Diagnosis not present

## 2022-10-13 DIAGNOSIS — Z0000 Encounter for general adult medical examination without abnormal findings: Secondary | ICD-10-CM | POA: Diagnosis not present

## 2022-10-13 DIAGNOSIS — N529 Male erectile dysfunction, unspecified: Secondary | ICD-10-CM

## 2022-10-13 LAB — CBC
HCT: 45 % (ref 39.0–52.0)
Hemoglobin: 15 g/dL (ref 13.0–17.0)
MCHC: 33.4 g/dL (ref 30.0–36.0)
MCV: 98 fl (ref 78.0–100.0)
Platelets: 191 10*3/uL (ref 150.0–400.0)
RBC: 4.59 Mil/uL (ref 4.22–5.81)
RDW: 12.9 % (ref 11.5–15.5)
WBC: 3.5 10*3/uL — ABNORMAL LOW (ref 4.0–10.5)

## 2022-10-13 LAB — COMPREHENSIVE METABOLIC PANEL
ALT: 17 U/L (ref 0–53)
AST: 22 U/L (ref 0–37)
Albumin: 4.8 g/dL (ref 3.5–5.2)
Alkaline Phosphatase: 50 U/L (ref 39–117)
BUN: 12 mg/dL (ref 6–23)
CO2: 30 mEq/L (ref 19–32)
Calcium: 10 mg/dL (ref 8.4–10.5)
Chloride: 101 mEq/L (ref 96–112)
Creatinine, Ser: 1.09 mg/dL (ref 0.40–1.50)
GFR: 89.52 mL/min (ref >60.00)
Glucose, Bld: 79 mg/dL (ref 70–99)
Potassium: 4 mEq/L (ref 3.5–5.1)
Sodium: 140 mEq/L (ref 135–145)
Total Bilirubin: 1 mg/dL (ref 0.2–1.2)
Total Protein: 7.4 g/dL (ref 6.0–8.3)

## 2022-10-13 LAB — LIPID PANEL
Cholesterol: 184 mg/dL (ref 0–200)
HDL: 58.7 mg/dL (ref >39.00)
LDL Cholesterol: 116 mg/dL — ABNORMAL HIGH (ref 0–99)
NonHDL: 125.53
Total CHOL/HDL Ratio: 3
Triglycerides: 48 mg/dL (ref 0.0–149.0)
VLDL: 9.6 mg/dL (ref 0.0–40.0)

## 2022-10-13 MED ORDER — TADALAFIL 20 MG PO TABS: 10.0000 mg | ORAL_TABLET | ORAL | 3 refills | Status: DC | PRN

## 2022-10-13 MED ORDER — FLUOXETINE HCL 20 MG PO TABS: 20.0000 mg | ORAL_TABLET | Freq: Every day | ORAL | 3 refills | Status: DC

## 2022-10-13 NOTE — Progress Notes (Signed)
bvv Therapist, music at [employer]: [phone] Provider: Loralee Pacas, MD   Chief Complaint:  Dean Allen is a 32 y.o. assigned male at birth who presents today for his annual comprehensive physical exam.     Medical assistant also reports: Chief Complaint  Patient presents with   New Patient (Initial Visit)   Annual Exam   He requested we address his medical problems in addition to his annual preventive physical exam at today's visit.  I explained at the beginning of our visit that the problem(s) would incur a separate charge and is not 100% insurance covered like the annual preventive visit and the patient/client agreed to proceed / complete both appointment types on the same day, accepting any additional charges.     Dean Allen was seen today for new patient (initial visit) and annual exam.  Preventative health care -     CBC -     Comprehensive metabolic panel -     Lipid panel  Screening examination for infectious disease -     Hepatitis C antibody -     HIV Antibody (routine testing w rflx)  Premature ejaculation Assessment & Plan: Recommend squeeze, stop-start, and sensate focus combined with Lexapro trial  Orders: -     Tadalafil; Take 0.5-1 tablets (10-20 mg total) by mouth every other day as needed for erectile dysfunction.  Dispense: 90 tablet; Refill: 3  Erectile dysfunction, unspecified erectile dysfunction type -     FLUoxetine HCl; Take 1 tablet (20 mg total) by mouth daily. To start: take just half tablet daily for one week  Dispense: 90 tablet; Refill: 3   Specifically, patient requests I address premature ejaculation.  He deferred having his hemorrhoid problem addressed.  Today's Health Maintenance Counseling and Anticipatory Guidance:   Eye exams:  every 1-2 years recommended.  Having vision corrected can improve the quality of day-to-day life.  Eye specialists can detect certain eye conditions such as cataracts, glaucoma and  age-related macular degeneration, which could lead to sight loss.  They can also detect certain rare cancers and diabetes, among other things.  Dean Allen reports his last eye exam was:  within 1 year has contact lenses Dental health: Discussed importance of regular tooth brushing, flossing, and dental visits q6 months.  Poor dentition can lead to serious medical problems - particularly problems with heart valves.  Dean Allen reports he has been keeping up with his dental visits.  He intends to do so in the future.  Sinus health: Encourage sterile saline nasal misting sinus rinses daily for pollen, to reduce allergies and risk for sinus infections.   Sterile can based misting products are recommended due to superior misting and ease of maintaining sterility. Sleep Apnea screening:  He  denies any significant problems with sleep quality or hypersomnolence or being advised that he has been told that he snores or has apnea Cardiovascular Risk Factor Reduction:   Advised patient of need for regular exercise and diet rich and fruits and vegetables and healthy fats to reduce risk of heart attack and stroke.  Avoid first- and second-hand smoke and stimulants.   Avoid extreme exercise- exercise in moderation (150 minutes per week is a good goal) Wt Readings from Last 3 Encounters:  10/13/22 167 lb 3.2 oz (75.8 kg)  03/25/14 173 lb (78.5 kg)  02/25/13 173 lb 11.2 oz (78.8 kg)   Body mass index is 26.19 kg/m. /  He reports his diet consists of chicken fish eggs, vegetable with dinners, avoids  processed food He reports his exercise includes 3-4 days weekly gets sweaty. Health maintenance and immunizations reviewed and he was encouraged to complete anything that is due: Immunization History  Administered Date(s) Administered   PFIZER(Purple Top)SARS-COV-2 Vaccination 04/28/2020   Tdap 05/24/2014   Health Maintenance Due  Topic Date Due   HIV Screening  Never done   Hepatitis C Screening  Never done   STD  screening: testing offered today, but patient declined as He considers themself to be low risk based on his sexual history    Substance use:  I discussed that my recommendation is total abstinence from all substances of abuse including smoke and 2nd hand smoke, alcohol, illicit drugs, smoking, inhalants, sugar.   Offered to assist with any use disorders or addictions.   Injury prevention: Discussed safety belts, safety helmets, smoke detectors. Penile cancer screening: Asked about genital warts or tumors/abnormalities of penis. Recommended Gardasil if none present, or cryo ablation if present. Testicular cancer screening:  Patient was advised to palpate testicles, scrotum, penis for masses and inform me of any.   Thyroid cancer screening: patient advised to check by palpating thyroid for nodules Prostate cancer screening:  Denies family history of prostate cancer or hematospermia so too young for screening by current guidelines. No results found for: "PSA"     Colon cancer screening:    Denies strong family history of colon cancer or blood in stool so no screening is indicated until age 50.  Had colonoscopy for hemorrhoids in 24, suggested consider repeat if blood in stool returns. I offered to send to rectal surgeon for hemorrhoid evaluate and repair and colon cancer screening if he wants to call back about that- no appointment needed. Skin cancer screening.  Advised regular sunscreen use. He denies worrisome, changing, or new skin lesions. Showed him pictures of melanomas for reference:   .  Return to care in 1 year for next preventative visit.   Loralee Pacas, MD     Subjective:   See problem-oriented charting in (overview sections of assessment & plan) for updated chart information added to chronic problems for future tracking  He has no acute complaints today.   ROS   I attest that I have reviewed and confirmed the patients current medications to meet the medication reconciliation  requirement - no medication prior Current Outpatient Medications  Medication Sig Dispense Refill   FLUoxetine (PROZAC) 20 MG tablet Take 1 tablet (20 mg total) by mouth daily. To start: take just half tablet daily for one week 90 tablet 3   tadalafil (CIALIS) 20 MG tablet Take 0.5-1 tablets (10-20 mg total) by mouth every other day as needed for erectile dysfunction. 90 tablet 3   No current facility-administered medications for this visit.    The following were reviewed and entered/updated in epic:    10/13/2022    9:25 AM  Depression screen PHQ 2/9  Decreased Interest 0  Down, Depressed, Hopeless 0  PHQ - 2 Score 0  Altered sleeping 0  Tired, decreased energy 1  Change in appetite 0  Feeling bad or failure about yourself  0  Trouble concentrating 1  Moving slowly or fidgety/restless 0  Suicidal thoughts 0  PHQ-9 Score 2  Difficult doing work/chores Not difficult at all   Past Medical History:  Diagnosis Date   Anxiety    Hemorrhoids    Patient Active Problem List   Diagnosis Date Noted   Premature ejaculation 10/13/2022   ED (erectile dysfunction) of non-organic  origin 08/29/2022   Iron deficiency anemia 01/09/2013   Lower gastrointestinal bleed 01/09/2013   EXTERNAL HEMORRHOIDS WITHOUT MENTION COMP 11/20/2009   Past Surgical History:  Procedure Laterality Date   APPENDECTOMY     COLONOSCOPY     01/18/13   Family History  Problem Relation Age of Onset   Glaucoma Maternal Grandfather    Arthritis Maternal Grandfather    Hypertension Mother    Alcoholism Father    Alcohol abuse Father    Breast cancer Maternal Grandmother    Colon cancer Neg Hx    Esophageal cancer Neg Hx    Rectal cancer Neg Hx    Stomach cancer Neg Hx    No Known Allergies Social History   Tobacco Use   Smoking status: Former   Smokeless tobacco: Never   Tobacco comments:    started in 2010  Substance Use Topics   Alcohol use: Yes    Alcohol/week: 3.0 standard drinks of alcohol     Types: 3 Shots of liquor per week    Comment: daily mixed drink   Drug use: No           Objective:  Physical Exam: BP 110/80 (BP Location: Left Arm, Patient Position: Sitting)   Pulse 62   Temp 97.9 F (36.6 C) (Temporal)   Ht 5' 7"$  (1.702 m)   Wt 167 lb 3.2 oz (75.8 kg)   SpO2 98%   BMI 26.19 kg/m   Body mass index is 26.19 kg/m. Not overweight by exam just muscular. Wt Readings from Last 3 Encounters:  10/13/22 167 lb 3.2 oz (75.8 kg)  03/25/14 173 lb (78.5 kg)  02/25/13 173 lb 11.2 oz (78.8 kg)   Gen: NAD, resting comfortably HEENT: TMs normal bilaterally. OP clear. No thyromegaly noted.  CV: RRR with no murmurs appreciated Pulm: NWOB, CTAB with no crackles, wheezes, or rhonchi GI: Normal bowel sounds present. Soft, Nontender, Nondistended. MSK: no edema, cyanosis, or clubbing noted Skin: warm, dry Neuro: CN2-12 grossly intact. Strength 5/5 in upper and lower extremities. Reflexes symmetric and intact bilaterally.  Psych: Normal affect and thought content

## 2022-10-13 NOTE — Assessment & Plan Note (Signed)
Recommend squeeze, stop-start, and sensate focus combined with Lexapro trial

## 2022-10-13 NOTE — Patient Instructions (Signed)
Please try these tips to maintain a healthy lifestyle:  Avoid processed foods like bologna, salami, spam, candy bars  Try to eat non-starchy and fiber-rich vegetables, 30%-plus lean proteins, and only healthy fats (nuts, extra virgin olive oil, fatty fish) or lean meats.  Try to completely eliminate sugar containing beverages. This includes juice, non diet soda, and sweet tea.   Drink at least 1 glass of water with each meal and aim for at least 8 glasses per day  Eat a diet rich and fruits and vegetables and healthy fats (plant and fish fats) to reduce risk of heart attack and stroke.  In particular, avocado, extra virgin olive oil, nuts, and fatty fish are known to be great for your blood vessels.  Exercise at least 150 minutes every week.  Try to do all 3 types of exercise: 1)  Stretching (yoga-type), 2) cardio 190  is max heart rate) and 3) resistance training exercises for muscle building.  Try to avoid exercise with a lot of impact (running on concrete is hard on joints) and extreme exercise (marathons) which have been shown to do more harm than good.   Keep challenging yourself to make positive and sustainable lifestyle changes. Love and be good to your future self!  You can't hate yourself into positive changes.  If you know you have an unhealthy coping strategy (e.g. substance or food misuse) or if depression/anxiety is holding you back, please let me know;  I am trained in addiction treatment and passionate about helping people to make positive life changes!  Get good sleep (8 hours on a consistent schedule.  Sleep is important for mood management, weight management, and cognitive performance. Snoring and sleep apnea are connected to alzheimer's dementia, depression, obesity, and irritability  For your mental status:  practice gratitude exercises daily, focus on self-growth and creative interests, and use mindfulness to stay out of negative emotional states.

## 2022-10-14 LAB — HIV ANTIBODY (ROUTINE TESTING W REFLEX): HIV 1&2 Ab, 4th Generation: NONREACTIVE

## 2022-10-14 LAB — HEPATITIS C ANTIBODY: Hepatitis C Ab: NONREACTIVE

## 2022-10-15 ENCOUNTER — Encounter: Payer: Self-pay | Admitting: Internal Medicine

## 2022-10-15 DIAGNOSIS — D72819 Decreased white blood cell count, unspecified: Secondary | ICD-10-CM | POA: Insufficient documentation

## 2022-10-15 NOTE — Progress Notes (Signed)
The low white blood cell(s) is probably a nothing finding... But we should not ignore it.  I recommend another visit in 1-3 months to go over it in detail. Here is the artificial intelligence analysis for the situation:  Comprehensive Review of the Case: The individual in question is a 32 year old who is not currently taking any medications and has no significant past medical history. The patient's white blood cell count has decreased from 5 to 3.5, which is the only laboratory abnormality noted, as the complete blood count (CBC) and comprehensive metabolic panel (CMP) are otherwise normal. The patient has tested negative for HIV and reports feeling well with no symptoms. The physical examination and vital signs are within normal limits. There is no mention of any exposures, onset and tempo of illness, signs and symptoms, associated signs and symptoms, imaging abnormalities, or descriptions of studies conducted. There is also no mention of the patient's illness course. The absence of these details limits the ability to identify potential exposures or risk factors that could contribute to the diagnosis.  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 This condition can present with an isolated decrease in white blood cells, particularly neutrophils, without any other abnormalities in the CBC or CMP. The patient's lack of symptoms and normal physical examination findings are consistent with idiopathic neutropenia, which is often an incidental finding. The presence of symptoms such as recurrent infections, fever, or other hematologic abnormalities could further suggest this diagnosis.  Expanded DDx:  1. Viral Infection: Even though the patient feels fine, a recent viral infection could cause transient neutropenia. Many viral infections are subclinical and may not cause noticeable symptoms. The patient's normal physical examination and lack of symptoms are consistent with a mild or resolving viral infection. The  presence of a recent history of viral-like symptoms or exposure to individuals with viral infections could further suggest this diagnosis.  2. Medication-Induced Neutropenia: Although the patient is not on any medications, it is important to consider that recent use of certain over-the-counter or prescription drugs, or exposure to toxins, could lead to a decrease in white blood cell count. The absence of medication use in this case makes this less likely, but a detailed medication history including recent use of non-prescription drugs or supplements is essential. The presence of recent drug use or exposure to known marrow suppressants could further suggest this diagnosis.  Alternative DDx:  1. Autoimmune Neutropenia: This condition can present with isolated neutropenia and is often benign. The patient's lack of symptoms and normal physical examination are consistent with autoimmune neutropenia. The presence of autoantibodies or a history of autoimmune disease could further suggest this diagnosis.  2. Bone Marrow Disorders: While the patient's CBC is otherwise normal, early myelodysplastic syndromes or other bone marrow disorders could present with isolated neutropenia. The presence of additional cytopenias or dysplastic changes on a bone marrow biopsy could further suggest this diagnosis.  3. Nutritional Deficiencies: Deficiencies in certain nutrients, such as vitamin B12 or folate, can cause neutropenia. The patient's normal CMP makes this less likely, but a detailed dietary history could reveal risk factors for deficiency. The presence of macrocytosis or other hematologic signs of deficiency could further suggest this diagnosis.  4. Hypersplenism: An enlarged spleen can sequester white blood cells, leading to neutropenia. The patient's normal physical examination makes this less likely, but imaging studies such as an ultrasound could reveal splenomegaly. The presence of an enlarged spleen on imaging  could further suggest this diagnosis.  5. Chronic Inflammatory Conditions: Conditions such  as rheumatoid arthritis or lupus can cause neutropenia. The patient's lack of symptoms makes this less likely, but the presence of other signs of chronic inflammation or positive autoimmune markers could further suggest this diagnosis.  6. Congenital Neutropenia: This is a rare genetic condition that presents with low neutrophil counts. The patient's age and lack of prior medical history make this less likely, but a family history of neutropenia or genetic testing could further suggest this diagnosis.  7. Infection with Other Pathogens: While the patient has tested negative for HIV, other infections such as hepatitis, Epstein-Barr virus, or cytomegalovirus could cause neutropenia. The absence of symptoms makes this less likely, but serological tests for these infections could further suggest this diagnosis.  8. Alcohol Use: Chronic alcohol use can lead to bone marrow suppression and neutropenia. There is no mention of alcohol use in this case, but a history of alcohol abuse could further suggest this diagnosis.  9. Endocrine Disorders: Thyroid disorders can sometimes cause changes in white blood cell counts. The patient's normal CMP makes this less likely, but a thyroid function test could further suggest this diagnosis.  10. Stress-Induced Neutropenia: Psychological or physical stress can transiently affect white blood cell counts. The patient's lack of symptoms and normal physical examination do not provide evidence for this, but a history of recent significant stress could further suggest this diagnosis.    Mild Leukopenia  A 32 year old individual with a recent finding of leukopenia, evidenced by a decrease in white blood cell count from 5 to 3.5, presents without any symptoms, and is otherwise healthy with no medication history. The HIV test is negative, and both the complete blood count (CBC) and  comprehensive metabolic panel (CMP) are reported as normal, apart from the noted leukopenia. Physical examination and vital signs are within normal limits. The clinical reasoning should focus on the isolated finding of leukopenia, which can be due to a variety of causes including viral infections, bone marrow disorders, autoimmune diseases, or medication effects, although the patient is not on any medications. The differential diagnosis includes benign ethnic neutropenia, viral infections, bone marrow disorders, autoimmune diseases, and medication effects.   Dx Given the current information, the following diagnostic studies should be considered:  - Repeat CBC to confirm the persistence of leukopenia and to check for any changes in other cell lines. - Peripheral blood smear to evaluate the morphology of white blood cells and to look for any abnormal cells that might suggest a hematologic disorder. - Bone marrow biopsy if peripheral smear or other findings suggest a bone marrow process. - Antinuclear antibody (ANA) test to screen for autoimmune disorders. - Viral panel to rule out other viral etiologies that can cause transient leukopenia.   Tx Since the patient is asymptomatic and the leukopenia is mild, the treatment plan may initially be conservative:  - Close monitoring of white blood cell count and general health status. - Patient education on signs and symptoms of infection to watch for, given the decreased white blood cell count. - Avoidance of unnecessary medications or supplements that could potentially affect bone marrow function. - Follow-up with a hematologist if leukopenia persists or worsens, or if additional abnormalities are identified in further testing.  It is important to note that treatment should be tailored to the underlying cause of the leukopenia, which will be better defined after the results of the diagnostic studies are available.  Loralee Pacas, MD  10/15/2022 8:51 AM

## 2023-12-20 ENCOUNTER — Encounter: Payer: Self-pay | Admitting: Internal Medicine

## 2023-12-20 ENCOUNTER — Ambulatory Visit (INDEPENDENT_AMBULATORY_CARE_PROVIDER_SITE_OTHER): Admitting: Internal Medicine

## 2023-12-20 VITALS — BP 116/74 | HR 77 | Temp 97.4°F | Ht 67.0 in | Wt 180.0 lb

## 2023-12-20 DIAGNOSIS — J3089 Other allergic rhinitis: Secondary | ICD-10-CM

## 2023-12-20 DIAGNOSIS — S4990XD Unspecified injury of shoulder and upper arm, unspecified arm, subsequent encounter: Secondary | ICD-10-CM | POA: Diagnosis not present

## 2023-12-20 DIAGNOSIS — E785 Hyperlipidemia, unspecified: Secondary | ICD-10-CM | POA: Diagnosis not present

## 2023-12-20 DIAGNOSIS — Z0001 Encounter for general adult medical examination with abnormal findings: Secondary | ICD-10-CM | POA: Diagnosis not present

## 2023-12-20 DIAGNOSIS — Z0000 Encounter for general adult medical examination without abnormal findings: Secondary | ICD-10-CM

## 2023-12-20 DIAGNOSIS — R790 Abnormal level of blood mineral: Secondary | ICD-10-CM | POA: Diagnosis not present

## 2023-12-20 LAB — COMPREHENSIVE METABOLIC PANEL WITH GFR
ALT: 23 U/L (ref 0–53)
AST: 27 U/L (ref 0–37)
Albumin: 4.6 g/dL (ref 3.5–5.2)
Alkaline Phosphatase: 53 U/L (ref 39–117)
BUN: 13 mg/dL (ref 6–23)
CO2: 31 meq/L (ref 19–32)
Calcium: 9.7 mg/dL (ref 8.4–10.5)
Chloride: 102 meq/L (ref 96–112)
Creatinine, Ser: 1.08 mg/dL (ref 0.40–1.50)
GFR: 89.77 mL/min (ref >60.00)
Glucose, Bld: 87 mg/dL (ref 70–99)
Potassium: 4.1 meq/L (ref 3.5–5.1)
Sodium: 137 meq/L (ref 135–145)
Total Bilirubin: 0.8 mg/dL (ref 0.2–1.2)
Total Protein: 7.4 g/dL (ref 6.0–8.3)

## 2023-12-20 LAB — CBC WITH DIFFERENTIAL/PLATELET
Basophils Absolute: 0 10*3/uL (ref 0.0–0.1)
Basophils Relative: 0.8 % (ref 0.0–3.0)
Eosinophils Absolute: 0 10*3/uL (ref 0.0–0.7)
Eosinophils Relative: 0.4 % (ref 0.0–5.0)
HCT: 45.5 % (ref 39.0–52.0)
Hemoglobin: 15.1 g/dL (ref 13.0–17.0)
Lymphocytes Relative: 62.7 % — ABNORMAL HIGH (ref 12.0–46.0)
Lymphs Abs: 2.3 10*3/uL (ref 0.7–4.0)
MCHC: 33.3 g/dL (ref 30.0–36.0)
MCV: 99.7 fl (ref 78.0–100.0)
Monocytes Absolute: 0.3 10*3/uL (ref 0.1–1.0)
Monocytes Relative: 9.5 % (ref 3.0–12.0)
Neutro Abs: 1 10*3/uL — ABNORMAL LOW (ref 1.4–7.7)
Neutrophils Relative %: 26.6 % — ABNORMAL LOW (ref 43.0–77.0)
Platelets: 205 10*3/uL (ref 150.0–400.0)
RBC: 4.56 Mil/uL (ref 4.22–5.81)
RDW: 12.6 % (ref 11.5–15.5)
WBC: 3.6 10*3/uL — ABNORMAL LOW (ref 4.0–10.5)

## 2023-12-20 LAB — LIPID PANEL
Cholesterol: 173 mg/dL (ref 0–200)
HDL: 44.3 mg/dL (ref >39.00)
LDL Cholesterol: 117 mg/dL — ABNORMAL HIGH (ref 0–99)
NonHDL: 129
Total CHOL/HDL Ratio: 4
Triglycerides: 60 mg/dL (ref 0.0–149.0)
VLDL: 12 mg/dL (ref 0.0–40.0)

## 2023-12-20 LAB — FERRITIN: Ferritin: 39 ng/mL (ref 22.0–322.0)

## 2023-12-20 NOTE — Progress Notes (Signed)
 [facility] at [facility] [address] [place], [place] [address] Office:  [phone]  -- Annual Preventive Medical Office Visit --  Patient:  Dean Allen      Age: 33 y.o.       Sex:  male  Date:   12/20/2023 Patient Care Team: Anthon Kins, MD as PCP - General (Internal Medicine) Today's Healthcare Provider: Anthon Kins, MD  ========================================= Chief complaint: Annual Exam (Fasting. Left shoulder clicking. Previous injury. ) Purpose of Visit: Comprehensive preventive health assessment and personalized health maintenance planning.  This encounter was conducted as a Comprehensive Physical Exam (CPE) preventive care annual visit. The patient's medical history and problem list were reviewed to inform individualized preventive care recommendations. No problem-specific medical treatment was provided during this visit.     Assessment & Plan Encounter for annual health examination During the routine annual wellness visit, there are no acute concerns. He is fasting for blood work. No vaccines are needed. The importance of dental and eye exams, along with lifestyle modifications for overall health maintenance, was discussed. Perform blood work. Encourage scheduling of dental and eye exams if not already done. Advise on lifestyle modifications, including avoiding junk food, maintaining a regular sleep schedule, and engaging in 150 minutes of exercise per week. Shoulder injury, unspecified laterality, subsequent encounter A suspicious for  rotator cuff tear in the left shoulder is suspected due to significant reduction in strength and function, with difficulty lifting weights and a history of shoulder injury. There is potential for a partial or full tear. Treatment options include conservative management and surgical intervention, with surgery reserved for severe cases or when conservative measures fail. Financial considerations were discussed,  emphasizing the timing of imaging and surgery within the same year to maximize insurance benefits. He will be referred to a sports medicine specialist for further evaluation and management. Start with a steroid injection for symptom relief. If imaging is pursued, recommend ultrasound before MRI. Hyperlipidemia, unspecified hyperlipidemia type Medications: not yet discussed Lab Results  Component Value Date   HDL 44.30 12/20/2023   HDL 58.70 10/13/2022   HDL 37.90 (L) 01/15/2009   CHOLHDL 4 12/20/2023   CHOLHDL 3 10/13/2022   CHOLHDL 4 01/15/2009   Lab Results  Component Value Date   LDLCALC 117 (H) 12/20/2023   LDLCALC 116 (H) 10/13/2022   LDLCALC 87 01/15/2009   Lab Results  Component Value Date   TRIG 60.0 12/20/2023   TRIG 48.0 10/13/2022   TRIG 42.0 01/15/2009   Lab Results  Component Value Date   CHOL 173 12/20/2023   CHOL 184 10/13/2022   CHOL 133 01/15/2009   The ASCVD Risk score (Arnett DK, et al., 2019) failed to calculate for the following reasons:   The 2019 ASCVD risk score is only valid for ages 78 to 22 Lab Results  Component Value Date   ALT 23 12/20/2023   AST 27 12/20/2023   ALKPHOS 53 12/20/2023   TSH 2.97 01/15/2009   Body mass index is 28.19 kg/m.  Lipoprotein(a), Apolipoprotein B (ApoB), and High-sensitivity C-reactive protein (hs-CRP) No results found for: "HSCRP", "LIPOA" Improving Your Cholesterol: Diet: Focus on a Mediterranean-style diet, limit saturated fats and sugars, and increase omega-3 fatty acids (fish, flaxseeds,nuts,extra virgin olive oil, avocados). Exercise: Engage in regular physical activity (aerobic exercises are particularly beneficial for HDL). Weight Management: Maintain a healthy weight. Smoking Cessation: Quitting smoking improves cholesterol levels.  Low ferritin Will order lab testing to guide management. Patient agreed possible  insurance noncoverage Allergic rhinitis due to other allergic trigger, unspecified  seasonality Chronic allergic rhinitis has recently worsened, presenting with nasal congestion and rhinorrhea, likely due to high pollen levels. Local environmental factors contributing to symptoms were discussed. Provide a handout on managing allergic rhinitis. Recommend daily saline nasal rinse and advise use of Flonase for symptom control. Diagnoses and all orders for this visit: Encounter for annual health examination -     Ambulatory referral to Sports Medicine -     Comprehensive metabolic panel with GFR -     CBC with Differential/Platelet -     TSH Rfx on Abnormal to Free T4 -     Ferritin Shoulder injury, unspecified laterality, subsequent encounter Hyperlipidemia, unspecified hyperlipidemia type -     Lipid panel Low ferritin -     Ferritin Allergic rhinitis due to other allergic trigger, unspecified seasonality   Reviewed/updated/encouraged completion: Immunization History  Administered Date(s) Administered   Dtap, Unspecified 01/01/1990, 03/05/1990, 05/22/1990, 02/05/1991, 03/17/1994   HIB, Unspecified 01/01/1990, 03/05/1990, 05/22/1990, 02/05/1991   Hep B, Unspecified 06/06/2002, 07/16/2002, 11/26/2002   MMR 02/05/1991, 03/17/1994   PFIZER(Purple Top)SARS-COV-2 Vaccination 04/28/2020   Polio, Unspecified 01/01/1990, 03/05/1990, 02/05/1991, 03/17/1994   Tdap 07/14/2008, 05/24/2014   There are no preventive care reminders to display for this patient. Health Maintenance  Topic Date Due   INFLUENZA VACCINE  03/29/2024   DTaP/Tdap/Td (8 - Td or Tdap) 05/24/2024   Hepatitis C Screening  Completed   HIV Screening  Completed   HPV VACCINES  Aged Out   Meningococcal B Vaccine  Aged Out   COVID-19 Vaccine  Discontinued   Reviewed the following verbally with patient and provided AVS materials:  HEALTH MAINTENANCE COUNSELING AND ANTICIPATORY GUIDANCE   Preventive Measure Recommendation  Eye Exams Every 1-2 years  Dental Care Cleanings every 6 months or more, brush/floss  3x daily  Sinus Care Saline spray rinses daily  Sleep 8 hours nightly, good sleep hygiene, e-monitoring if any daytime drowsiness  Diet Fruits/vegetables/fiber/healthy fats, balance and moderation  Exercise 150 minutes weekly  Risk Behaviors Discouraged any/all high risk behaviors   CANCER SCREENING SHARED DECISION MAKING   Penile/Testicle/Scrotum Encouraged self-monitoring and reporting of genital abnormalities. Patient reports none.  Thyroid Checked and advised to palpate thyroid for nodules  Prostate Individualized risks/benefits/costs discussed   PSA RESULTS: No results found for: "PSA"      Colon HM Colonoscopy   This patient has no relevant Health Maintenance data.     Lung Current guidelines recommend individuals aged 88 to 23 who currently smoke or formerly smoked and have a >= 20 pack-year smoking history should undergo annual screening with low-dose computed tomography (LDCT). Tobacco Use: Medium Risk (12/20/2023)   Patient History    Smoking Tobacco Use: Former    Smokeless Tobacco Use: Never    Passive Exposure: Never    Skin Advised regular sunscreen use. Patient denies worrisome, changing, or new skin lesions. Offered to include images in chart for surveillance. Showed patient these pictures of melanomas for reference to educate for self-monitoring.  Other Cancers Discussed lack of screening guidelines and insurance coverage for other cancer types.   IMPORTANT HEALTH REMINDERS: Report any new or changing skin lesions promptly Maintain recommended screening schedules Discuss any new family history of cancer at future visits Follow up on any new symptoms that persist more than two weeks        Subjective  HPI Discussed the use of AI scribe software for clinical note transcription with the  patient, who gave verbal consent to proceed.  History of Present Illness  33 year old male who presents for an annual physical exam and evaluation of a left shoulder  injury.  He has a left shoulder injury with clicking sounds, attributed to a previous football injury. He experiences difficulty lifting weights, with a significant decrease in bench press capacity, now limited to 10-15 pounds on each side.  He has a history of tonsillitis with abscess formation on each tonsil. Over the past two weeks, he has experienced significant allergies, including nasal congestion and rhinorrhea. He has been using over-the-counter allergy medications but notes limited availability of these medications.  He is fasting for the appointment and plans to have blood work done today. He tries to schedule early appointments to accommodate fasting requirements. 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 He has two children, a daughter aged 61 and a son. No new symptoms or concerns beyond those discussed.     Review of Systems  Constitutional:  Negative for chills, diaphoresis, fever, malaise/fatigue and weight loss.  HENT:  Negative for congestion, ear discharge, ear pain, hearing loss, nosebleeds, sinus pain, sore throat and tinnitus.   Eyes:  Negative for blurred vision, double vision, photophobia, pain, discharge and redness.  Respiratory:  Negative for cough, hemoptysis, sputum production, shortness of breath, wheezing and stridor.   Cardiovascular:  Negative for chest pain, palpitations, orthopnea, claudication, leg swelling and PND.  Gastrointestinal:  Negative for abdominal pain, blood in stool, constipation, diarrhea, heartburn, melena, nausea and vomiting.  Genitourinary:  Negative for dysuria, flank pain, frequency, hematuria and urgency.  Musculoskeletal:  Negative for back pain, falls, joint pain, myalgias and neck pain.  Skin:  Negative for itching and rash.  Neurological:  Negative for dizziness, tingling, tremors, sensory change, speech change, focal weakness, seizures, loss of consciousness, weakness and headaches.   Endo/Heme/Allergies:  Negative for environmental allergies and polydipsia. Does not bruise/bleed easily.  Psychiatric/Behavioral:  Negative for depression, hallucinations, memory loss, substance abuse and suicidal ideas. The patient is not nervous/anxious and does not have insomnia.      Completed medication reconciliation: No current outpatient medications on file prior to visit.   No current facility-administered medications on file prior to visit.   Medications Discontinued During This Encounter  Medication Reason   tadalafil  (CIALIS ) 20 MG tablet    FLUoxetine  (PROZAC ) 20 MG tablet   The following were reviewed and/or entered/updated into our electronic MEDICAL RECORD NUMBERPast Medical History:  Diagnosis Date   Anxiety    Hemorrhoids    Past Surgical History:  Procedure Laterality Date   APPENDECTOMY     COLONOSCOPY     01/18/13   Social History   Socioeconomic History   Marital status: Single    Spouse name: Not on file   Number of children: Not on file   Years of education: Not on file   Highest education level: Master's degree (e.g., MA, MS, MEng, MEd, MSW, MBA)  Occupational History   Not on file  Tobacco Use   Smoking status: Former    Passive exposure: Never   Smokeless tobacco: Never   Tobacco comments:    started in 2010  Substance and Sexual Activity   Alcohol use: Yes    Alcohol/week: 3.0 standard drinks of alcohol    Types: 3 Shots of liquor per week    Comment: daily mixed drink   Drug use: No   Sexual activity: Yes    Birth  control/protection: Condom, None  Other Topics Concern   Not on file  Social History Narrative   Not on file   Social Drivers of Health   Financial Resource Strain: Low Risk  (12/19/2023)   Overall Financial Resource Strain (CARDIA)    Difficulty of Paying Living Expenses: Not hard at all  Food Insecurity: No Food Insecurity (12/19/2023)   Hunger Vital Sign    Worried About Running Out of Food in the Last Year: Never true     Ran Out of Food in the Last Year: Never true  Transportation Needs: No Transportation Needs (12/19/2023)   PRAPARE - Administrator, Civil Service (Medical): No    Lack of Transportation (Non-Medical): No  Physical Activity: Sufficiently Active (12/19/2023)   Exercise Vital Sign    Days of Exercise per Week: 3 days    Minutes of Exercise per Session: 60 min  Stress: No Stress Concern Present (12/19/2023)   [employer] of Occupational Health - Occupational Stress Questionnaire    Feeling of Stress : Not at all  Social Connections: Socially Integrated (12/19/2023)   Social Connection and Isolation Panel [NHANES]    Frequency of Communication with Friends and Family: More than three times a week    Frequency of Social Gatherings with Friends and Family: Once a week    Attends Religious Services: More than 4 times per year    Active Member of [employer] or Organizations: Yes    Attends Banker Meetings: More than 4 times per year    Marital Status: Living with partner  Intimate Partner Violence: Unknown (12/20/2022)   Received from [employer], [facility]   HITS    Physically Hurt: Not on file    Insult or Talk Down To: Not on file    Threaten Physical Harm: Not on file    Scream or Curse: Not on file      12/19/2023   11:09 AM  Alcohol Use Disorder Test (AUDIT)  1. How often do you have a drink containing alcohol? 2  2. How many drinks containing alcohol do you have on a typical day when you are drinking? 0  3. How often do you have six or more drinks on one occasion? 0  AUDIT-C Score 2      Patient-reported   Family History  Problem Relation Age of Onset   Glaucoma Maternal Grandfather    Arthritis Maternal Grandfather    Hypertension Mother    Alcoholism Father    Alcohol abuse Father    Breast cancer Maternal Grandmother    Colon cancer Neg Hx    Esophageal cancer Neg Hx    Rectal cancer Neg Hx    Stomach cancer Neg Hx   No Known  Allergies Social History   Substance and Sexual Activity  Sexual Activity Yes   Birth control/protection: Condom, None   Social History   Tobacco Use   Smoking status: Former    Passive exposure: Never   Smokeless tobacco: Never   Tobacco comments:    started in 2010  Substance Use Topics   Alcohol use: Yes    Alcohol/week: 3.0 standard drinks of alcohol    Types: 3 Shots of liquor per week    Comment: daily mixed drink   Drug use: No      12/20/2023    8:16 AM  Depression screen PHQ 2/9  Decreased Interest 0  Down, Depressed, Hopeless 0  PHQ - 2 Score 0  Altered  sleeping 0  Tired, decreased energy 0  Change in appetite 0  Feeling bad or failure about yourself  0  Trouble concentrating 0  Moving slowly or fidgety/restless 0  Suicidal thoughts 0  PHQ-9 Score 0  Difficult doing work/chores Not difficult at all    Objective  BP 116/74   Pulse 77   Temp (!) 97.4 F (36.3 C) (Temporal)   Ht 5\' 7"  (1.702 m)   Wt 180 lb (81.6 kg)   SpO2 98%   BMI 28.19 kg/m  BP Readings from Last 3 Encounters:  12/20/23 116/74  10/13/22 110/80  05/22/19 138/90   Wt Readings from Last 10 Encounters:  12/20/23 180 lb (81.6 kg)  10/13/22 167 lb 3.2 oz (75.8 kg)  03/25/14 173 lb (78.5 kg)  02/25/13 173 lb 11.2 oz (78.8 kg)  01/18/13 175 lb (79.4 kg)  01/14/13 175 lb 9.6 oz (79.7 kg)  01/09/13 173 lb 9.6 oz (78.7 kg)    Physical Exam Physical Exam  NEUROLOGICAL: Normal gait and balance. GEN: No acute distress, resting comfortably. HEENT: Tympanic membranes normal appearing bilaterally, oropharynx clear, no thyromegaly noted, no palpable lymphadenopathy or thyroid nodules.Right nasal passage swollen.Tonsils with evidence of past infection and abscess formation.  CARDIOVASCULAR: S1 and S2 heart sounds with regular rate and rhythm, no murmurs appreciated. PULMONARY: Normal work of breathing, clear to auscultation bilaterally, no crackles, wheezes, or rhonchi. ABDOMEN: Soft,  nontender, nondistended. MSK: No edema, cyanosis, or clubbing noted. SKIN: Warm, dry, no lesions of concern observed. NEUROLOGICAL: Cranial nerves II-XII grossly intact, strength 5/5 in upper and lower extremities, reflexes symmetric and intact bilaterally. PSYCH: Normal affect and thought content, pleasant and cooperative.  Results Reviewed: Last depression screening scores    12/20/2023    8:16 AM 10/13/2022    9:25 AM  PHQ 2/9 Scores  PHQ - 2 Score 0 0  PHQ- 9 Score 0 2   Last fall risk screening    10/13/2022    9:25 AM  Fall Risk   Falls in the past year? 0  Number falls in past yr: 0  Injury with Fall? 0  Risk for fall due to : No Fall Risks  Follow up Falls evaluation completed   Last CBC Lab Results  Component Value Date   WBC 3.6 (L) 12/20/2023   HGB 15.1 12/20/2023   HCT 45.5 12/20/2023   MCV 99.7 12/20/2023   MCH 24.0 (L) 02/19/2013   RDW 12.6 12/20/2023   PLT 205.0 12/20/2023   Last metabolic panel Lab Results  Component Value Date   GLUCOSE 87 12/20/2023   NA 137 12/20/2023   K 4.1 12/20/2023   CL 102 12/20/2023   CO2 31 12/20/2023   BUN 13 12/20/2023   CREATININE 1.08 12/20/2023   GFR 89.77 12/20/2023   CALCIUM 9.7 12/20/2023   PROT 7.4 12/20/2023   ALBUMIN 4.6 12/20/2023   BILITOT 0.8 12/20/2023   ALKPHOS 53 12/20/2023   AST 27 12/20/2023   ALT 23 12/20/2023   Last lipids Lab Results  Component Value Date   CHOL 173 12/20/2023   HDL 44.30 12/20/2023   LDLCALC 117 (H) 12/20/2023   TRIG 60.0 12/20/2023   CHOLHDL 4 12/20/2023   Last hemoglobin A1c No results found for: "HGBA1C" Last thyroid functions Lab Results  Component Value Date   TSH 2.97 01/15/2009   Last vitamin D No results found for: "25OHVITD2", "25OHVITD3", "VD25OH" Last vitamin B12 and Folate Lab Results  Component Value Date   VITAMINB12 366  01/14/2013   FOLATE 9.1 01/14/2013        ======================================  Notes:  This document was  synthesized by artificial intelligence ([employer]bridge) using HIPAA-compliant recording of the clinical interaction;   We discussed the use of AI scribe software for clinical note transcription with the patient, who gave verbal consent to proceed.    This encounter employed state-of-the-art, real-time, collaborative documentation. The patient was empowered to actively review and assist in updating their electronic medical record on a shared monitor, ensuring transparency and improving accuracy.    Prior to and at the beginning of Comprehensive Physical Exam (CPE) preventive care annual visit appointment types  we clarify to patients "Our goal today is to focus on your preventive or annual Comprehensive Physical Exam (CPE) preventive care annual visit, which typically covers routine screenings and overall health maintenance. However, if you share any new or concerning symptoms--such as dizziness, passing out, severe pain, or anything else that may point to a more serious issue--we are both legally and ethically required to evaluate it. We cannot simply overlook or ignore such concerns, even if you later decide you don't want to discuss them, because it could jeopardize your health.  If addressing a new concern takes us  beyond the scope of the preventive visit, we may need to bill separately for that portion of care. We understand financial considerations are important, and we're happy to discuss your options if something new comes up. However, we want to be clear that once you mention a potentially serious issue, we must investigate it; we can't ethically or legally exclude that from our records or our evaluation. Please let us  know all of your questions or worries. Together, we can decide how best to manage them and how to minimize any unexpected costs, but we want to keep you safe above all else."   This disclosure is mandated by professional ethics and legal obligations, as healthcare providers must address any  substantial health concerns raised during any patient interaction and a comprehensive ROS is required by insurance companies for billing preventive-care visit type.   This disclosure ultimately discourages patients financially from reporting significant health issues.Recording duration: 24 minutes

## 2023-12-20 NOTE — Patient Instructions (Signed)
 ALLERGY MANAGEMENT PLAN  This plan is designed to help manage your allergic rhinitis (nasal allergies) effectively. Follow these steps daily for best results.  3x daily sinus saline sprays  Saline mist into nose leaning over sink at 45 degrees Once daily, after a sinus rinse, use sensimist.  Bedtime usually best. Once daily for xyzal 5 mg in am Take benadryl 25 mg at bedtime also if allergic mucus is persisting      DAILY TREATMENT ROUTINE   Time of Day Treatment Steps  Morning 1. Saline Nasal Spray - Use to cleanse nasal passages 2. Xyzal (levocetirizine) - Take one tablet daily   Throughout Day Saline Nasal Spray - Use 2 additional times (mid-day and afternoon)   Evening/Bedtime 1. Saline Nasal Rinse - Thoroughly clean nasal passages 2. Flonase Sensimist - Apply after nasal rinse 3. Benadryl (diphenhydramine) - Take 25mg  if experiencing persistent congestion    PROPER TECHNIQUE GUIDE       Saline Nasal Spray/Rinse Technique: Lean forward over sink at a 45-degree angle Turn head slightly to one side Insert spray tip into upper nostril Spray gently while breathing lightly through your nose Repeat on other side Gently blow nose to clear excess solution Use saline spray 3 times daily to keep nasal passages moist and clear allergens.       Flonase Sensimist Technique: Shake bottle gently before each use Prime the bottle if it's new or hasn't been used for a week Tilt your head forward slightly Insert tip into nostril, pointing away from the center of your nose Spray while inhaling gently Repeat in other nostril Use Flonase Sensimist once daily, preferably at bedtime after using saline rinse. It may take several days of regular use to feel maximum benefit.   WHY FLONASE SENSIMIST?   Benefits of Flonase Sensimist:  Alcohol-free and scent-free formula - gentler on sensitive nasal passages Fine mist application - more comfortable with less dripping down throat Effectively  relieves nasal congestion, sneezing, runny nose, and even eye symptoms 24-hour relief with once-daily dosing Uses a more potent form of fluticasone that works at a lower dose Less liquid per spray means less discomfort  UNDERSTANDING YOUR MEDICATIONS   Medication How It Works Important Notes  Flonase Sensimist (fluticasone furoate) Reduces inflammation in nasal passages, addressing the underlying cause of allergy symptoms - Takes several days for full effect - Use daily for best results - Safe for long-term use   Xyzal (levocetirizine) Blocks histamine to reduce allergy symptoms like sneezing and itching - Take at the same time each day - May cause drowsiness in some people - Once-daily dosing   Benadryl (diphenhydramine) Antihistamine that provides additional relief for breakthrough symptoms - Causes drowsiness - Use only at bedtime - For occasional use when needed   Saline Spray/Rinse Physically removes allergens and moistens nasal passages - Safe to use frequently - Improves effectiveness of other treatments - Reduces nasal irritation    CONTACT YOUR PROVIDER IF: Your symptoms do not improve after 1-2 weeks of following this plan You develop sinus pain with fever or green/yellow discharge You experience frequent nosebleeds You develop new or worsening symptoms You have questions about your treatment plan     ADDITIONAL ALLERGY MANAGEMENT TIPS   HELPFUL STRATEGIES: ?? Keep windows closed during high pollen seasons ??? Use allergen-proof covers for pillows and mattresses ?? Vacuum regularly with a HEPA filter vacuum ?? Shower and change clothes after spending time outdoors ?? Check local pollen counts and limit outdoor time when  counts are high ?? Stay well-hydrated to help keep mucous membranes moist      Please call our office at [facility] PHONE] if you have any questions about your allergy management plan.

## 2023-12-21 LAB — TSH RFX ON ABNORMAL TO FREE T4: TSH: 2.56 u[IU]/mL (ref 0.450–4.500)

## 2023-12-26 ENCOUNTER — Encounter: Payer: Self-pay | Admitting: Internal Medicine

## 2023-12-26 DIAGNOSIS — E785 Hyperlipidemia, unspecified: Secondary | ICD-10-CM | POA: Insufficient documentation

## 2023-12-26 DIAGNOSIS — E663 Overweight: Secondary | ICD-10-CM | POA: Insufficient documentation

## 2023-12-26 DIAGNOSIS — J309 Allergic rhinitis, unspecified: Secondary | ICD-10-CM | POA: Insufficient documentation

## 2024-01-17 NOTE — Progress Notes (Signed)
    Ben Jillaine Waren D.[facility] [address] [place] [address] Phone: [phone]   Assessment and Plan:     1. Chronic left shoulder pain (Primary) -Chronic with exacerbation, initial visit - Left shoulder pain with continued intermittent flares of pain with physical activity.  Pain started after fall on the left shoulder in November 2024.  Patient has full range of motion and strength at today's visit, so I do not suspect full-thickness tearing occurred.  Suspect partial-thickness tearing that is recovering versus subacromial bursitis versus rotator cuff tendinopathy resulting from contusion - Start meloxicam 15 mg daily x2 weeks.  If still having pain after 2 weeks, complete 3rd-week of NSAID. May use remaining NSAID as needed once daily for pain control.  Do not to use additional over-the-counter NSAIDs (ibuprofen, naproxen, Advil, Aleve, etc.) while taking prescription NSAIDs.  May use Tylenol  [phone] mg 2 to 3 times a day for breakthrough pain. - Start HEP for rotator cuff and shoulder  15 additional minutes spent for educating Therapeutic Home Exercise Program.  This included exercises focusing on stretching, strengthening, with focus on eccentric aspects.   Long term goals include an improvement in range of motion, strength, endurance as well as avoiding reinjury. Patient's frequency would include in 1-2 times a day, 3-5 times a week for a duration of 6-12 weeks. Proper technique shown and discussed handout in great detail with ATC.  All questions were discussed and answered.     Pertinent previous records reviewed include internal medicine note 12/20/2023  Follow Up: 4 weeks for reevaluation.  Could consider left shoulder x-ray versus ultrasound evaluation for rotator cuff tearing versus physical therapy   Subjective:   I, Leone Ralphs am a scribe for Dr. Cleora Daft.    Chief Complaint: left shoulder pain   HPI:   01/18/2024 Patient is a  33 year old male with left shoulder pain. Patient states that it was clicking and clacking. [name] on it last November. Can deal with the pain but wants to go back to lifting weights. If he does something too quickly he feels the pain, but certain movements don't bother him. Not using anything for the pain not even ice or heat. Patient states that it will eventually stop hurting.    Relevant Historical Information: None pertinent  Additional pertinent review of systems negative.   Current Outpatient Medications:    meloxicam (MOBIC) 15 MG tablet, Take 1 tablet (15 mg total) by mouth daily., Disp: 30 tablet, Rfl: 0   Objective:     Vitals:   01/18/24 1117  BP: 118/60  Pulse: 67  SpO2: 96%  Weight: 177 lb 3.2 oz (80.4 kg)  Height: 5\' 7"  (1.702 m)      Body mass index is 27.75 kg/m.    Physical Exam:    Gen: Appears well, nad, nontoxic and pleasant Neuro:sensation intact, strength is 5/5 with df/pf/inv/ev, muscle tone wnl Skin: no suspicious lesion or defmority Psych: A&O, appropriate mood and affect  Left shoulder:  No deformity, swelling or muscle wasting No scapular winging FF 180, abd 170 with painful arc, int 0, ext 90 NTTP over the [place], clavicle, ac, coracoid, biceps groove, humerus, deltoid, trapezius, cervical spine Positive Hawkins, empty can  neg neer,  obriens, crossarm, subscap liftoff, speeds Neg ant drawer, sulcus sign, apprehension Negative Spurling's test bilat FROM of neck    Electronically signed by:  Marshall Skeeter D.[facility] 11:36 AM 01/18/24

## 2024-01-18 ENCOUNTER — Ambulatory Visit

## 2024-01-18 ENCOUNTER — Ambulatory Visit (INDEPENDENT_AMBULATORY_CARE_PROVIDER_SITE_OTHER): Admitting: Sports Medicine

## 2024-01-18 VITALS — BP 118/60 | HR 67 | Ht 67.0 in | Wt 177.2 lb

## 2024-01-18 DIAGNOSIS — M25512 Pain in left shoulder: Secondary | ICD-10-CM

## 2024-01-18 DIAGNOSIS — G8929 Other chronic pain: Secondary | ICD-10-CM | POA: Diagnosis not present

## 2024-01-18 MED ORDER — MELOXICAM 15 MG PO TABS: 15.0000 mg | ORAL_TABLET | Freq: Every day | ORAL | 0 refills | Status: AC

## 2024-01-18 NOTE — Patient Instructions (Addendum)
 Rotator cuff HEP. - Start meloxicam 15 mg daily x2 weeks.  If still having pain after 2 weeks, complete 3rd-week of NSAID. May use remaining NSAID as needed once daily for pain control.  Do not to use additional over-the-counter NSAIDs (ibuprofen, naproxen, Advil, Aleve, etc.) while taking prescription NSAIDs.  May use Tylenol  [phone] mg 2 to 3 times a day for breakthrough pain. Follow up in 4 weeks.

## 2024-02-14 NOTE — Progress Notes (Deleted)
    Dean Allen D.[facility] [address] [place] [address] Phone: [phone]   Assessment and Plan:     There are no diagnoses linked to this encounter.  ***   Pertinent previous records reviewed include ***    Follow Up: ***     Subjective:   I, Dean Allen, am serving as a Neurosurgeon for Doctor Dean Allen  Chief Complaint: left shoulder pain    HPI:    01/18/2024 Patient is a 33 year old male with left shoulder pain. Patient states that it was clicking and clacking. Dean Allen on it last November. Can deal with the pain but wants to go back to lifting weights. If he does something too quickly he feels the pain, but certain movements don't bother him. Not using anything for the pain not even ice or heat. Patient states that it will eventually stop hurting.    02/15/2024 Patient states   Relevant Historical Information: None pertinent  Additional pertinent review of systems negative.   Current Outpatient Medications:    meloxicam  (MOBIC ) 15 MG tablet, Take 1 tablet (15 mg total) by mouth daily., Disp: 30 tablet, Rfl: 0   Objective:     There were no vitals filed for this visit.    There is no height or weight on file to calculate BMI.    Physical Exam:    ***   Electronically signed by:  Dean Allen D.[facility] 7:41 AM 02/14/24

## 2024-02-15 ENCOUNTER — Ambulatory Visit: Admitting: Sports Medicine

## 2024-12-20 ENCOUNTER — Encounter: Admitting: Internal Medicine
